# Patient Record
Sex: Male | Born: 1935 | Race: White | Hispanic: No | Smoking: Never smoker
Health system: Southern US, Community
[De-identification: ages and names within clinical notes are randomized; demographics above are authoritative.]

## PROBLEM LIST (undated history)

## (undated) DIAGNOSIS — I10 Essential (primary) hypertension: Secondary | ICD-10-CM

## (undated) DIAGNOSIS — E119 Type 2 diabetes mellitus without complications: Secondary | ICD-10-CM

## (undated) DIAGNOSIS — M109 Gout, unspecified: Secondary | ICD-10-CM

## (undated) DIAGNOSIS — N189 Chronic kidney disease, unspecified: Secondary | ICD-10-CM

## (undated) DIAGNOSIS — E7800 Pure hypercholesterolemia, unspecified: Secondary | ICD-10-CM

## (undated) DIAGNOSIS — N200 Calculus of kidney: Secondary | ICD-10-CM

## (undated) DIAGNOSIS — K279 Peptic ulcer, site unspecified, unspecified as acute or chronic, without hemorrhage or perforation: Secondary | ICD-10-CM

## (undated) DIAGNOSIS — M1990 Unspecified osteoarthritis, unspecified site: Secondary | ICD-10-CM

## (undated) HISTORY — DX: Chronic kidney disease, unspecified: N18.9

## (undated) HISTORY — DX: Pure hypercholesterolemia, unspecified: E78.00

## (undated) HISTORY — DX: Gout, unspecified: M10.9

## (undated) HISTORY — DX: Unspecified osteoarthritis, unspecified site: M19.90

## (undated) HISTORY — DX: Type 2 diabetes mellitus without complications: E11.9

## (undated) HISTORY — DX: Peptic ulcer, site unspecified, unspecified as acute or chronic, without hemorrhage or perforation: K27.9

## (undated) HISTORY — PX: BACK SURGERY: SHX140

## (undated) HISTORY — DX: Calculus of kidney: N20.0

## (undated) HISTORY — DX: Essential (primary) hypertension: I10

---

## 1998-07-24 ENCOUNTER — Encounter: Payer: Self-pay | Admitting: Internal Medicine

## 1998-07-24 ENCOUNTER — Ambulatory Visit (HOSPITAL_COMMUNITY): Admission: RE | Admit: 1998-07-24 | Discharge: 1998-07-24 | Payer: Self-pay | Admitting: Internal Medicine

## 2001-01-05 ENCOUNTER — Ambulatory Visit (HOSPITAL_COMMUNITY): Admission: RE | Admit: 2001-01-05 | Discharge: 2001-01-05 | Payer: Self-pay | Admitting: Family Medicine

## 2001-01-05 ENCOUNTER — Encounter: Payer: Self-pay | Admitting: Family Medicine

## 2001-10-27 ENCOUNTER — Encounter: Payer: Self-pay | Admitting: *Deleted

## 2001-10-27 ENCOUNTER — Ambulatory Visit (HOSPITAL_COMMUNITY): Admission: RE | Admit: 2001-10-27 | Discharge: 2001-10-27 | Payer: Self-pay | Admitting: *Deleted

## 2002-07-30 ENCOUNTER — Encounter: Payer: Self-pay | Admitting: Family Medicine

## 2002-07-30 ENCOUNTER — Ambulatory Visit (HOSPITAL_COMMUNITY): Admission: RE | Admit: 2002-07-30 | Discharge: 2002-07-30 | Payer: Self-pay | Admitting: Family Medicine

## 2004-04-02 ENCOUNTER — Ambulatory Visit (HOSPITAL_COMMUNITY): Admission: RE | Admit: 2004-04-02 | Discharge: 2004-04-02 | Payer: Self-pay | Admitting: Gastroenterology

## 2005-08-12 ENCOUNTER — Ambulatory Visit (HOSPITAL_COMMUNITY): Admission: RE | Admit: 2005-08-12 | Discharge: 2005-08-12 | Payer: Self-pay | Admitting: Family Medicine

## 2007-01-11 ENCOUNTER — Emergency Department (HOSPITAL_COMMUNITY): Admission: EM | Admit: 2007-01-11 | Discharge: 2007-01-11 | Payer: Self-pay | Admitting: Emergency Medicine

## 2008-12-07 ENCOUNTER — Inpatient Hospital Stay (HOSPITAL_COMMUNITY): Admission: EM | Admit: 2008-12-07 | Discharge: 2008-12-15 | Payer: Self-pay | Admitting: Emergency Medicine

## 2008-12-09 ENCOUNTER — Ambulatory Visit: Payer: Self-pay | Admitting: Infectious Diseases

## 2008-12-09 ENCOUNTER — Encounter: Payer: Self-pay | Admitting: Internal Medicine

## 2008-12-09 ENCOUNTER — Encounter (INDEPENDENT_AMBULATORY_CARE_PROVIDER_SITE_OTHER): Payer: Self-pay | Admitting: Neurosurgery

## 2008-12-12 ENCOUNTER — Encounter (INDEPENDENT_AMBULATORY_CARE_PROVIDER_SITE_OTHER): Payer: Self-pay | Admitting: Internal Medicine

## 2008-12-15 ENCOUNTER — Inpatient Hospital Stay: Admission: RE | Admit: 2008-12-15 | Discharge: 2009-01-05 | Payer: Self-pay | Admitting: Internal Medicine

## 2009-01-24 ENCOUNTER — Encounter: Admission: RE | Admit: 2009-01-24 | Discharge: 2009-01-24 | Payer: Self-pay | Admitting: Internal Medicine

## 2009-01-25 ENCOUNTER — Encounter: Payer: Self-pay | Admitting: Infectious Diseases

## 2009-01-31 ENCOUNTER — Encounter: Admission: RE | Admit: 2009-01-31 | Discharge: 2009-01-31 | Payer: Self-pay | Admitting: Internal Medicine

## 2009-02-06 ENCOUNTER — Ambulatory Visit: Payer: Self-pay | Admitting: Infectious Diseases

## 2009-02-06 DIAGNOSIS — M216X9 Other acquired deformities of unspecified foot: Secondary | ICD-10-CM | POA: Insufficient documentation

## 2009-02-06 DIAGNOSIS — M109 Gout, unspecified: Secondary | ICD-10-CM

## 2009-02-06 DIAGNOSIS — G062 Extradural and subdural abscess, unspecified: Secondary | ICD-10-CM | POA: Insufficient documentation

## 2009-02-06 LAB — CONVERTED CEMR LAB
Basophils Absolute: 0 10*3/uL (ref 0.0–0.1)
Basophils Relative: 2 % — ABNORMAL HIGH (ref 0–1)
CRP: 1.4 mg/dL — ABNORMAL HIGH (ref <0.6)
Eosinophils Absolute: 0 10*3/uL (ref 0.0–0.7)
Eosinophils Relative: 0 % (ref 0–5)
HCT: 24.3 % — ABNORMAL LOW (ref 39.0–52.0)
Hemoglobin: 8 g/dL — ABNORMAL LOW (ref 13.0–17.0)
Lymphocytes Relative: 44 % (ref 12–46)
MCHC: 32.9 g/dL (ref 30.0–36.0)
MCV: 86.2 fL (ref >=78.0)
Monocytes Absolute: 0.6 10*3/uL (ref 0.1–1.0)
RDW: 17.1 % — ABNORMAL HIGH (ref 11.5–15.5)

## 2009-02-15 ENCOUNTER — Encounter: Payer: Self-pay | Admitting: Infectious Diseases

## 2009-03-12 ENCOUNTER — Encounter: Payer: Self-pay | Admitting: Emergency Medicine

## 2009-03-12 ENCOUNTER — Ambulatory Visit: Payer: Self-pay | Admitting: Diagnostic Radiology

## 2009-03-12 ENCOUNTER — Inpatient Hospital Stay (HOSPITAL_COMMUNITY): Admission: EM | Admit: 2009-03-12 | Discharge: 2009-03-15 | Payer: Self-pay | Admitting: Internal Medicine

## 2009-03-14 ENCOUNTER — Ambulatory Visit: Payer: Self-pay | Admitting: Pulmonary Disease

## 2009-03-14 ENCOUNTER — Other Ambulatory Visit: Payer: Self-pay | Admitting: Infectious Diseases

## 2009-03-19 ENCOUNTER — Inpatient Hospital Stay (HOSPITAL_COMMUNITY): Admission: EM | Admit: 2009-03-19 | Discharge: 2009-03-23 | Payer: Self-pay | Admitting: Emergency Medicine

## 2009-03-20 ENCOUNTER — Ambulatory Visit: Payer: Self-pay | Admitting: Infectious Diseases

## 2009-03-22 ENCOUNTER — Ambulatory Visit: Payer: Self-pay | Admitting: Vascular Surgery

## 2009-03-22 ENCOUNTER — Encounter (INDEPENDENT_AMBULATORY_CARE_PROVIDER_SITE_OTHER): Payer: Self-pay | Admitting: Internal Medicine

## 2009-03-24 ENCOUNTER — Emergency Department (HOSPITAL_COMMUNITY): Admission: EM | Admit: 2009-03-24 | Discharge: 2009-03-24 | Payer: Self-pay | Admitting: Emergency Medicine

## 2009-04-02 ENCOUNTER — Inpatient Hospital Stay (HOSPITAL_COMMUNITY): Admission: EM | Admit: 2009-04-02 | Discharge: 2009-04-05 | Payer: Self-pay | Admitting: Emergency Medicine

## 2009-04-03 ENCOUNTER — Telehealth: Payer: Self-pay | Admitting: Infectious Diseases

## 2009-04-06 ENCOUNTER — Inpatient Hospital Stay (HOSPITAL_COMMUNITY): Admission: EM | Admit: 2009-04-06 | Discharge: 2009-04-11 | Payer: Self-pay | Admitting: Emergency Medicine

## 2009-04-24 ENCOUNTER — Emergency Department (HOSPITAL_COMMUNITY): Admission: EM | Admit: 2009-04-24 | Discharge: 2009-04-25 | Payer: Self-pay | Admitting: Emergency Medicine

## 2009-05-17 ENCOUNTER — Telehealth: Payer: Self-pay

## 2010-07-16 ENCOUNTER — Encounter: Payer: Self-pay | Admitting: Infectious Disease

## 2010-09-26 LAB — URINALYSIS, ROUTINE W REFLEX MICROSCOPIC
Bilirubin Urine: NEGATIVE
Nitrite: NEGATIVE
Specific Gravity, Urine: 1.025 (ref 1.005–1.030)
pH: 5 (ref 5.0–8.0)

## 2010-09-26 LAB — URINE MICROSCOPIC-ADD ON

## 2010-09-26 LAB — POCT I-STAT, CHEM 8
BUN: 31 mg/dL — ABNORMAL HIGH (ref 6–23)
Chloride: 101 mEq/L (ref 96–112)
Sodium: 134 mEq/L — ABNORMAL LOW (ref 135–145)

## 2010-09-26 LAB — URINE CULTURE

## 2010-09-27 LAB — GLUCOSE, CAPILLARY
Glucose-Capillary: 110 mg/dL — ABNORMAL HIGH (ref 70–99)
Glucose-Capillary: 118 mg/dL — ABNORMAL HIGH (ref 70–99)
Glucose-Capillary: 132 mg/dL — ABNORMAL HIGH (ref 70–99)
Glucose-Capillary: 59 mg/dL — ABNORMAL LOW (ref 70–99)
Glucose-Capillary: 63 mg/dL — ABNORMAL LOW (ref 70–99)
Glucose-Capillary: 70 mg/dL (ref 70–99)
Glucose-Capillary: 90 mg/dL (ref 70–99)
Glucose-Capillary: 133 mg/dL — ABNORMAL HIGH (ref 70–99)
Glucose-Capillary: 145 mg/dL — ABNORMAL HIGH (ref 70–99)
Glucose-Capillary: 66 mg/dL — ABNORMAL LOW (ref 70–99)
Glucose-Capillary: 88 mg/dL (ref 70–99)

## 2010-09-27 LAB — COMPREHENSIVE METABOLIC PANEL
ALT: 12 U/L (ref 0–53)
ALT: 16 U/L (ref 0–53)
AST: 121 U/L — ABNORMAL HIGH (ref 0–37)
AST: 13 U/L (ref 0–37)
Albumin: 1.8 g/dL — ABNORMAL LOW (ref 3.5–5.2)
Albumin: 1.8 g/dL — ABNORMAL LOW (ref 3.5–5.2)
Albumin: 2 g/dL — ABNORMAL LOW (ref 3.5–5.2)
Alkaline Phosphatase: 55 U/L (ref 39–117)
Alkaline Phosphatase: 76 U/L (ref 39–117)
BUN: 10 mg/dL (ref 6–23)
BUN: 12 mg/dL (ref 6–23)
BUN: 7 mg/dL (ref 6–23)
BUN: 8 mg/dL (ref 6–23)
BUN: 19 mg/dL (ref 6–23)
BUN: 33 mg/dL — ABNORMAL HIGH (ref 6–23)
CO2: 22 mEq/L (ref 19–32)
CO2: 22 mEq/L (ref 19–32)
CO2: 22 mEq/L (ref 19–32)
CO2: 21 mEq/L (ref 19–32)
Calcium: 8 mg/dL — ABNORMAL LOW (ref 8.4–10.5)
Calcium: 8.1 mg/dL — ABNORMAL LOW (ref 8.4–10.5)
Calcium: 8.3 mg/dL — ABNORMAL LOW (ref 8.4–10.5)
Calcium: 9.3 mg/dL (ref 8.4–10.5)
Chloride: 105 mEq/L (ref 96–112)
Chloride: 98 mEq/L (ref 96–112)
Creatinine, Ser: 0.83 mg/dL (ref 0.4–1.5)
Creatinine, Ser: 0.88 mg/dL (ref 0.4–1.5)
Creatinine, Ser: 0.89 mg/dL (ref 0.4–1.5)
Creatinine, Ser: 0.91 mg/dL (ref 0.4–1.5)
Creatinine, Ser: 1.08 mg/dL (ref 0.4–1.5)
Creatinine, Ser: 1.75 mg/dL — ABNORMAL HIGH (ref 0.4–1.5)
GFR calc Af Amer: >60 mL/min (ref >60)
GFR calc Af Amer: >60 mL/min (ref >60)
GFR calc non Af Amer: >60 mL/min (ref >60)
GFR calc non Af Amer: >60 mL/min (ref >60)
GFR calc non Af Amer: 38 mL/min — ABNORMAL LOW (ref >60)
GFR calc non Af Amer: 57 mL/min — ABNORMAL LOW (ref >60)
Glucose, Bld: 41 mg/dL — ABNORMAL LOW (ref 70–99)
Glucose, Bld: 81 mg/dL (ref 70–99)
Glucose, Bld: 132 mg/dL — ABNORMAL HIGH (ref 70–99)
Glucose, Bld: 49 mg/dL — ABNORMAL LOW (ref 70–99)
Potassium: 3.4 mEq/L — ABNORMAL LOW (ref 3.5–5.1)
Potassium: 4.2 mEq/L (ref 3.5–5.1)
Sodium: 129 mEq/L — ABNORMAL LOW (ref 135–145)
Sodium: 133 mEq/L — ABNORMAL LOW (ref 135–145)
Total Bilirubin: 0.6 mg/dL (ref 0.3–1.2)
Total Bilirubin: 0.7 mg/dL (ref 0.3–1.2)
Total Bilirubin: 0.7 mg/dL (ref 0.3–1.2)
Total Protein: 5 g/dL — ABNORMAL LOW (ref 6.0–8.3)
Total Protein: 5.3 g/dL — ABNORMAL LOW (ref 6.0–8.3)
Total Protein: 6.8 g/dL (ref 6.0–8.3)

## 2010-09-27 LAB — RENAL FUNCTION PANEL
Albumin: 2.4 g/dL — ABNORMAL LOW (ref 3.5–5.2)
Albumin: 2.5 g/dL — ABNORMAL LOW (ref 3.5–5.2)
BUN: 28 mg/dL — ABNORMAL HIGH (ref 6–23)
Calcium: 8.4 mg/dL (ref 8.4–10.5)
Calcium: 8.5 mg/dL (ref 8.4–10.5)
Chloride: 101 mEq/L (ref 96–112)
Creatinine, Ser: 1.23 mg/dL (ref 0.4–1.5)
Creatinine, Ser: 1.24 mg/dL (ref 0.4–1.5)
GFR calc Af Amer: >60 mL/min (ref >60)
GFR calc non Af Amer: 58 mL/min — ABNORMAL LOW (ref >60)
Glucose, Bld: 81 mg/dL (ref 70–99)
Phosphorus: 3.7 mg/dL (ref 2.3–4.6)
Potassium: 3.8 mEq/L (ref 3.5–5.1)
Sodium: 134 mEq/L — ABNORMAL LOW (ref 135–145)

## 2010-09-27 LAB — HEMOGLOBIN A1C: Mean Plasma Glucose: 105 mg/dL

## 2010-09-27 LAB — CULTURE, BLOOD (ROUTINE X 2)
Culture: NO GROWTH
Culture: NO GROWTH

## 2010-09-27 LAB — URINE CULTURE
Colony Count: NO GROWTH
Colony Count: NO GROWTH
Culture: NO GROWTH
Culture: NO GROWTH

## 2010-09-27 LAB — CLOSTRIDIUM DIFFICILE EIA
C difficile Toxins A+B, EIA: NEGATIVE
C difficile Toxins A+B, EIA: NEGATIVE
C difficile Toxins A+B, EIA: NEGATIVE

## 2010-09-27 LAB — BASIC METABOLIC PANEL
BUN: 14 mg/dL (ref 6–23)
BUN: 22 mg/dL (ref 6–23)
CO2: 23 mEq/L (ref 19–32)
CO2: 25 mEq/L (ref 19–32)
Calcium: 8.5 mg/dL (ref 8.4–10.5)
Chloride: 101 mEq/L (ref 96–112)
Creatinine, Ser: 1.23 mg/dL (ref 0.4–1.5)
GFR calc Af Amer: >60 mL/min (ref >60)
GFR calc non Af Amer: 57 mL/min — ABNORMAL LOW (ref >60)
GFR calc non Af Amer: 58 mL/min — ABNORMAL LOW (ref >60)
Glucose, Bld: 127 mg/dL — ABNORMAL HIGH (ref 70–99)
Glucose, Bld: 98 mg/dL (ref 70–99)
Potassium: 3.7 mEq/L (ref 3.5–5.1)
Potassium: 4.3 mEq/L (ref 3.5–5.1)
Sodium: 134 mEq/L — ABNORMAL LOW (ref 135–145)

## 2010-09-27 LAB — PHOSPHORUS: Phosphorus: 3.7 mg/dL (ref 2.3–4.6)

## 2010-09-27 LAB — URINALYSIS, ROUTINE W REFLEX MICROSCOPIC
Bilirubin Urine: NEGATIVE
Glucose, UA: NEGATIVE mg/dL
Ketones, ur: NEGATIVE mg/dL
Nitrite: NEGATIVE
Protein, ur: 30 mg/dL — AB
Urobilinogen, UA: 0.2 mg/dL (ref 0.0–1.0)
Urobilinogen, UA: 0.2 mg/dL (ref 0.0–1.0)

## 2010-09-27 LAB — ABO/RH: ABO/RH(D): A POS

## 2010-09-27 LAB — DIFFERENTIAL
Basophils Absolute: 0 10*3/uL (ref 0.0–0.1)
Basophils Absolute: 0 10*3/uL (ref 0.0–0.1)
Basophils Relative: 0 % (ref 0–1)
Basophils Relative: 0 % (ref 0–1)
Eosinophils Absolute: 0 10*3/uL (ref 0.0–0.7)
Eosinophils Relative: 1 % (ref 0–5)
Eosinophils Relative: 0 % (ref 0–5)
Eosinophils Relative: 0 % (ref 0–5)
Lymphocytes Relative: 5 % — ABNORMAL LOW (ref 12–46)
Lymphocytes Relative: 8 % — ABNORMAL LOW (ref 12–46)
Lymphocytes Relative: 5 % — ABNORMAL LOW (ref 12–46)
Lymphocytes Relative: 6 % — ABNORMAL LOW (ref 12–46)
Lymphocytes Relative: 7 % — ABNORMAL LOW (ref 12–46)
Lymphs Abs: 0.6 10*3/uL — ABNORMAL LOW (ref 0.7–4.0)
Lymphs Abs: 0.4 10*3/uL — ABNORMAL LOW (ref 0.7–4.0)
Monocytes Absolute: 0.5 10*3/uL (ref 0.1–1.0)
Monocytes Absolute: 0.5 10*3/uL (ref 0.1–1.0)
Monocytes Absolute: 0.7 10*3/uL (ref 0.1–1.0)
Monocytes Relative: 7 % (ref 3–12)
Monocytes Relative: 4 % (ref 3–12)
Monocytes Relative: 6 % (ref 3–12)
Neutro Abs: 6.5 10*3/uL (ref 1.7–7.7)
Neutro Abs: 7.1 10*3/uL (ref 1.7–7.7)
Neutrophils Relative %: 84 % — ABNORMAL HIGH (ref 43–77)
Neutrophils Relative %: 88 % — ABNORMAL HIGH (ref 43–77)
Neutrophils Relative %: 90 % — ABNORMAL HIGH (ref 43–77)
WBC Morphology: INCREASED

## 2010-09-27 LAB — CBC
HCT: 27.9 % — ABNORMAL LOW (ref 39.0–52.0)
HCT: 29.8 % — ABNORMAL LOW (ref 39.0–52.0)
HCT: 30.7 % — ABNORMAL LOW (ref 39.0–52.0)
HCT: 25.8 % — ABNORMAL LOW (ref 39.0–52.0)
HCT: 29.5 % — ABNORMAL LOW (ref 39.0–52.0)
Hemoglobin: 9.8 g/dL — ABNORMAL LOW (ref 13.0–17.0)
Hemoglobin: 9.9 g/dL — ABNORMAL LOW (ref 13.0–17.0)
Hemoglobin: 10.3 g/dL — ABNORMAL LOW (ref 13.0–17.0)
MCHC: 32.5 g/dL (ref 30.0–36.0)
MCHC: 33.1 g/dL (ref 30.0–36.0)
MCHC: 33.2 g/dL (ref 30.0–36.0)
MCHC: 31.7 g/dL (ref 30.0–36.0)
MCHC: 32.4 g/dL (ref 30.0–36.0)
MCHC: 32.7 g/dL (ref 30.0–36.0)
MCHC: 33.4 g/dL (ref 30.0–36.0)
MCHC: 33.9 g/dL (ref 30.0–36.0)
MCV: 92.4 fL (ref 78.0–100.0)
MCV: 92.7 fL (ref 78.0–100.0)
MCV: 95.5 fL (ref 78.0–100.0)
MCV: 95.5 fL (ref 78.0–100.0)
MCV: 95.6 fL (ref 78.0–100.0)
MCV: 95.6 fL (ref 78.0–100.0)
MCV: 95.9 fL (ref 78.0–100.0)
Platelets: 247 10*3/uL (ref 150–400)
Platelets: 191 10*3/uL (ref 150–400)
Platelets: 226 10*3/uL (ref 150–400)
Platelets: 258 10*3/uL (ref 150–400)
Platelets: 265 10*3/uL (ref 150–400)
Platelets: 278 10*3/uL (ref 150–400)
Platelets: 288 10*3/uL (ref 150–400)
RBC: 3.02 MIL/uL — ABNORMAL LOW (ref 4.22–5.81)
RBC: 3.19 MIL/uL — ABNORMAL LOW (ref 4.22–5.81)
RBC: 3.28 MIL/uL — ABNORMAL LOW (ref 4.22–5.81)
RBC: 3.13 MIL/uL — ABNORMAL LOW (ref 4.22–5.81)
RDW: 20.8 % — ABNORMAL HIGH (ref 11.5–15.5)
RDW: 20.9 % — ABNORMAL HIGH (ref 11.5–15.5)
RDW: 20 % — ABNORMAL HIGH (ref 11.5–15.5)
RDW: 20 % — ABNORMAL HIGH (ref 11.5–15.5)
RDW: 20.3 % — ABNORMAL HIGH (ref 11.5–15.5)
RDW: 20.9 % — ABNORMAL HIGH (ref 11.5–15.5)
RDW: 21.1 % — ABNORMAL HIGH (ref 11.5–15.5)
WBC: 8 10*3/uL (ref 4.0–10.5)
WBC: 13 10*3/uL — ABNORMAL HIGH (ref 4.0–10.5)
WBC: 16.1 10*3/uL — ABNORMAL HIGH (ref 4.0–10.5)
WBC: 9 10*3/uL (ref 4.0–10.5)
WBC: 9.2 10*3/uL (ref 4.0–10.5)

## 2010-09-27 LAB — MAGNESIUM
Magnesium: 1.6 mg/dL (ref 1.5–2.5)
Magnesium: 1.6 mg/dL (ref 1.5–2.5)
Magnesium: 1.9 mg/dL (ref 1.5–2.5)

## 2010-09-27 LAB — OVA AND PARASITE EXAMINATION

## 2010-09-27 LAB — CARDIAC PANEL(CRET KIN+CKTOT+MB+TROPI)
Relative Index: INVALID (ref 0.0–2.5)
Total CK: 12 U/L (ref 7–232)
Troponin I: 0.02 ng/mL (ref 0.00–0.06)

## 2010-09-27 LAB — URINE MICROSCOPIC-ADD ON

## 2010-09-27 LAB — FECAL LACTOFERRIN, QUANT

## 2010-09-27 LAB — CROSSMATCH: Antibody Screen: NEGATIVE

## 2010-09-27 LAB — STOOL CULTURE

## 2010-09-27 LAB — T4, FREE: Free T4: 1.27 ng/dL (ref 0.80–1.80)

## 2010-09-27 LAB — FERRITIN: Ferritin: 367 ng/mL — ABNORMAL HIGH (ref 22–322)

## 2010-09-27 LAB — IRON AND TIBC

## 2010-09-28 LAB — COMPREHENSIVE METABOLIC PANEL
ALT: 12 U/L (ref 0–53)
ALT: 12 U/L (ref 0–53)
ALT: 9 U/L (ref 0–53)
AST: 13 U/L (ref 0–37)
AST: 14 U/L (ref 0–37)
AST: 9 U/L (ref 0–37)
Albumin: 2.4 g/dL — ABNORMAL LOW (ref 3.5–5.2)
Albumin: 2.7 g/dL — ABNORMAL LOW (ref 3.5–5.2)
Alkaline Phosphatase: 54 U/L (ref 39–117)
BUN: 23 mg/dL (ref 6–23)
BUN: 32 mg/dL — ABNORMAL HIGH (ref 6–23)
CO2: 21 mEq/L (ref 19–32)
CO2: 21 mEq/L (ref 19–32)
CO2: 17 mEq/L — ABNORMAL LOW (ref 19–32)
Calcium: 7.7 mg/dL — ABNORMAL LOW (ref 8.4–10.5)
Calcium: 8 mg/dL — ABNORMAL LOW (ref 8.4–10.5)
Calcium: 8.7 mg/dL (ref 8.4–10.5)
Calcium: 9.3 mg/dL (ref 8.4–10.5)
Chloride: 107 mEq/L (ref 96–112)
Chloride: 113 mEq/L — ABNORMAL HIGH (ref 96–112)
Creatinine, Ser: 0.97 mg/dL (ref 0.4–1.5)
Creatinine, Ser: 1.13 mg/dL (ref 0.4–1.5)
Creatinine, Ser: 1.2 mg/dL (ref 0.4–1.5)
GFR calc Af Amer: >60 mL/min (ref >60)
GFR calc Af Amer: >60 mL/min (ref >60)
GFR calc Af Amer: >60 mL/min (ref >60)
GFR calc non Af Amer: >60 mL/min (ref >60)
GFR calc non Af Amer: >60 mL/min (ref >60)
GFR calc non Af Amer: 59 mL/min — ABNORMAL LOW (ref >60)
Glucose, Bld: 145 mg/dL — ABNORMAL HIGH (ref 70–99)
Glucose, Bld: 157 mg/dL — ABNORMAL HIGH (ref 70–99)
Glucose, Bld: 183 mg/dL — ABNORMAL HIGH (ref 70–99)
Potassium: 3.7 mEq/L (ref 3.5–5.1)
Potassium: 4.2 mEq/L (ref 3.5–5.1)
Sodium: 136 mEq/L (ref 135–145)
Sodium: 140 mEq/L (ref 135–145)
Sodium: 136 mEq/L (ref 135–145)
Total Bilirubin: 0.5 mg/dL (ref 0.3–1.2)
Total Protein: 5.7 g/dL — ABNORMAL LOW (ref 6.0–8.3)
Total Protein: 6.2 g/dL (ref 6.0–8.3)

## 2010-09-28 LAB — CBC
HCT: 25.3 % — ABNORMAL LOW (ref 39.0–52.0)
HCT: 25.6 % — ABNORMAL LOW (ref 39.0–52.0)
HCT: 28.3 % — ABNORMAL LOW (ref 39.0–52.0)
HCT: 28.8 % — ABNORMAL LOW (ref 39.0–52.0)
HCT: 32.6 % — ABNORMAL LOW (ref 39.0–52.0)
Hemoglobin: 10.5 g/dL — ABNORMAL LOW (ref 13.0–17.0)
Hemoglobin: 8.1 g/dL — ABNORMAL LOW (ref 13.0–17.0)
Hemoglobin: 8.1 g/dL — ABNORMAL LOW (ref 13.0–17.0)
Hemoglobin: 8.4 g/dL — ABNORMAL LOW (ref 13.0–17.0)
Hemoglobin: 8.5 g/dL — ABNORMAL LOW (ref 13.0–17.0)
Hemoglobin: 8.6 g/dL — ABNORMAL LOW (ref 13.0–17.0)
Hemoglobin: 9.4 g/dL — ABNORMAL LOW (ref 13.0–17.0)
Hemoglobin: 11.3 g/dL — ABNORMAL LOW (ref 13.0–17.0)
MCHC: 33.2 g/dL (ref 30.0–36.0)
MCHC: 33.4 g/dL (ref 30.0–36.0)
MCHC: 33.6 g/dL (ref 30.0–36.0)
MCHC: 33.8 g/dL (ref 30.0–36.0)
MCHC: 33.8 g/dL (ref 30.0–36.0)
MCHC: 34.6 g/dL (ref 30.0–36.0)
MCV: 94.6 fL (ref 78.0–100.0)
MCV: 94.9 fL (ref 78.0–100.0)
MCV: 94.9 fL (ref 78.0–100.0)
MCV: 95 fL (ref 78.0–100.0)
MCV: 95.2 fL (ref 78.0–100.0)
MCV: 95.4 fL (ref 78.0–100.0)
MCV: 95.5 fL (ref 78.0–100.0)
MCV: 95.7 fL (ref 78.0–100.0)
MCV: 96.1 fL (ref 78.0–100.0)
MCV: 93.6 fL (ref 78.0–100.0)
Platelets: 224 10*3/uL (ref 150–400)
Platelets: 242 10*3/uL (ref 150–400)
Platelets: 273 10*3/uL (ref 150–400)
Platelets: 277 10*3/uL (ref 150–400)
Platelets: 287 10*3/uL (ref 150–400)
RBC: 2.49 MIL/uL — ABNORMAL LOW (ref 4.22–5.81)
RBC: 2.5 MIL/uL — ABNORMAL LOW (ref 4.22–5.81)
RBC: 2.56 MIL/uL — ABNORMAL LOW (ref 4.22–5.81)
RBC: 2.73 MIL/uL — ABNORMAL LOW (ref 4.22–5.81)
RBC: 2.96 MIL/uL — ABNORMAL LOW (ref 4.22–5.81)
RBC: 3.04 MIL/uL — ABNORMAL LOW (ref 4.22–5.81)
RBC: 3.48 MIL/uL — ABNORMAL LOW (ref 4.22–5.81)
RDW: 21.9 % — ABNORMAL HIGH (ref 11.5–15.5)
RDW: 21.9 % — ABNORMAL HIGH (ref 11.5–15.5)
RDW: 22.5 % — ABNORMAL HIGH (ref 11.5–15.5)
WBC: 13.8 10*3/uL — ABNORMAL HIGH (ref 4.0–10.5)
WBC: 13.9 10*3/uL — ABNORMAL HIGH (ref 4.0–10.5)
WBC: 14.2 10*3/uL — ABNORMAL HIGH (ref 4.0–10.5)
WBC: 17.2 10*3/uL — ABNORMAL HIGH (ref 4.0–10.5)
WBC: 7.1 10*3/uL (ref 4.0–10.5)
WBC: 8.8 10*3/uL (ref 4.0–10.5)
WBC: 9.9 10*3/uL (ref 4.0–10.5)

## 2010-09-28 LAB — URINALYSIS, MICROSCOPIC ONLY
Bilirubin Urine: NEGATIVE
Hgb urine dipstick: NEGATIVE
Ketones, ur: NEGATIVE mg/dL
Nitrite: NEGATIVE
Protein, ur: NEGATIVE mg/dL
Urobilinogen, UA: 0.2 mg/dL (ref 0.0–1.0)

## 2010-09-28 LAB — CULTURE, BLOOD (ROUTINE X 2)
Culture: NO GROWTH
Culture: NO GROWTH

## 2010-09-28 LAB — SEDIMENTATION RATE
Sed Rate: 100 mm/hr — ABNORMAL HIGH (ref 0–16)
Sed Rate: 79 mm/hr — ABNORMAL HIGH (ref 0–16)

## 2010-09-28 LAB — URINALYSIS, ROUTINE W REFLEX MICROSCOPIC
Glucose, UA: NEGATIVE mg/dL
Glucose, UA: NEGATIVE mg/dL
Ketones, ur: 40 mg/dL — AB
Ketones, ur: NEGATIVE mg/dL
Leukocytes, UA: NEGATIVE
Leukocytes, UA: NEGATIVE
Nitrite: NEGATIVE
Protein, ur: 30 mg/dL — AB
Protein, ur: 30 mg/dL — AB
Urobilinogen, UA: 0.2 mg/dL (ref 0.0–1.0)
Urobilinogen, UA: 0.2 mg/dL (ref 0.0–1.0)

## 2010-09-28 LAB — DIFFERENTIAL
Basophils Relative: 0 % (ref 0–1)
Basophils Relative: 0 % (ref 0–1)
Basophils Relative: 0 % (ref 0–1)
Basophils Relative: 0 % (ref 0–1)
Eosinophils Absolute: 0 10*3/uL (ref 0.0–0.7)
Eosinophils Absolute: 0 10*3/uL (ref 0.0–0.7)
Eosinophils Absolute: 0 10*3/uL (ref 0.0–0.7)
Eosinophils Absolute: 0 10*3/uL (ref 0.0–0.7)
Eosinophils Relative: 0 % (ref 0–5)
Eosinophils Relative: 0 % (ref 0–5)
Eosinophils Relative: 0 % (ref 0–5)
Eosinophils Relative: 0 % (ref 0–5)
Eosinophils Relative: 0 % (ref 0–5)
Eosinophils Relative: 0 % (ref 0–5)
Lymphocytes Relative: 11 % — ABNORMAL LOW (ref 12–46)
Lymphocytes Relative: 5 % — ABNORMAL LOW (ref 12–46)
Lymphocytes Relative: 8 % — ABNORMAL LOW (ref 12–46)
Lymphocytes Relative: 6 % — ABNORMAL LOW (ref 12–46)
Lymphs Abs: 0.8 10*3/uL (ref 0.7–4.0)
Lymphs Abs: 1 10*3/uL (ref 0.7–4.0)
Monocytes Absolute: 0.4 10*3/uL (ref 0.1–1.0)
Monocytes Absolute: 0.6 10*3/uL (ref 0.1–1.0)
Monocytes Absolute: 0.7 10*3/uL (ref 0.1–1.0)
Monocytes Absolute: 0.9 10*3/uL (ref 0.1–1.0)
Monocytes Absolute: 1.8 10*3/uL — ABNORMAL HIGH (ref 0.1–1.0)
Monocytes Relative: 3 % (ref 3–12)
Monocytes Relative: 5 % (ref 3–12)
Monocytes Relative: 5 % (ref 3–12)
Monocytes Relative: 8 % (ref 3–12)
Neutro Abs: 12.5 10*3/uL — ABNORMAL HIGH (ref 1.7–7.7)
Neutro Abs: 15.6 10*3/uL — ABNORMAL HIGH (ref 1.7–7.7)
Neutrophils Relative %: 87 % — ABNORMAL HIGH (ref 43–77)
Neutrophils Relative %: 87 % — ABNORMAL HIGH (ref 43–77)
Neutrophils Relative %: 87 % — ABNORMAL HIGH (ref 43–77)
WBC Morphology: INCREASED

## 2010-09-28 LAB — BASIC METABOLIC PANEL
BUN: 12 mg/dL (ref 6–23)
BUN: 13 mg/dL (ref 6–23)
BUN: 16 mg/dL (ref 6–23)
CO2: 26 mEq/L (ref 19–32)
Calcium: 7.9 mg/dL — ABNORMAL LOW (ref 8.4–10.5)
Calcium: 8.1 mg/dL — ABNORMAL LOW (ref 8.4–10.5)
Calcium: 8.3 mg/dL — ABNORMAL LOW (ref 8.4–10.5)
Chloride: 103 mEq/L (ref 96–112)
Chloride: 104 mEq/L (ref 96–112)
Chloride: 108 mEq/L (ref 96–112)
Chloride: 95 mEq/L — ABNORMAL LOW (ref 96–112)
Creatinine, Ser: 0.93 mg/dL (ref 0.4–1.5)
Creatinine, Ser: 0.95 mg/dL (ref 0.4–1.5)
Creatinine, Ser: 1.02 mg/dL (ref 0.4–1.5)
Creatinine, Ser: 1.06 mg/dL (ref 0.4–1.5)
GFR calc Af Amer: >60 mL/min (ref >60)
GFR calc Af Amer: >60 mL/min (ref >60)
GFR calc Af Amer: >60 mL/min (ref >60)
GFR calc non Af Amer: >60 mL/min (ref >60)
GFR calc non Af Amer: >60 mL/min (ref >60)
GFR calc non Af Amer: >60 mL/min (ref >60)
GFR calc non Af Amer: >60 mL/min (ref >60)
Glucose, Bld: 160 mg/dL — ABNORMAL HIGH (ref 70–99)
Glucose, Bld: 94 mg/dL (ref 70–99)
Potassium: 3.3 mEq/L — ABNORMAL LOW (ref 3.5–5.1)
Potassium: 3.6 mEq/L (ref 3.5–5.1)
Sodium: 134 mEq/L — ABNORMAL LOW (ref 135–145)
Sodium: 135 mEq/L (ref 135–145)
Sodium: 137 mEq/L (ref 135–145)

## 2010-09-28 LAB — HEPATIC FUNCTION PANEL
ALT: 8 U/L (ref 0–53)
AST: 17 U/L (ref 0–37)
Alkaline Phosphatase: 49 U/L (ref 39–117)
Indirect Bilirubin: 0.5 mg/dL (ref 0.3–0.9)
Total Protein: 5.6 g/dL — ABNORMAL LOW (ref 6.0–8.3)

## 2010-09-28 LAB — PROTIME-INR
INR: 1.4 (ref 0.00–1.49)
INR: 1.2 (ref 0.00–1.49)
Prothrombin Time: 15.1 seconds (ref 11.6–15.2)

## 2010-09-28 LAB — URINE MICROSCOPIC-ADD ON

## 2010-09-28 LAB — VANCOMYCIN, TROUGH: Vancomycin Tr: 15.6 ug/mL (ref 10.0–20.0)

## 2010-09-28 LAB — POCT CARDIAC MARKERS
CKMB, poc: 1.2 ng/mL (ref 1.0–8.0)
Myoglobin, poc: 138 ng/mL (ref 12–200)
Troponin i, poc: <0.05 ng/mL (ref 0.00–0.09)

## 2010-09-28 LAB — GLUCOSE, CAPILLARY
Glucose-Capillary: 116 mg/dL — ABNORMAL HIGH (ref 70–99)
Glucose-Capillary: 120 mg/dL — ABNORMAL HIGH (ref 70–99)
Glucose-Capillary: 135 mg/dL — ABNORMAL HIGH (ref 70–99)
Glucose-Capillary: 141 mg/dL — ABNORMAL HIGH (ref 70–99)
Glucose-Capillary: 145 mg/dL — ABNORMAL HIGH (ref 70–99)
Glucose-Capillary: 150 mg/dL — ABNORMAL HIGH (ref 70–99)
Glucose-Capillary: 194 mg/dL — ABNORMAL HIGH (ref 70–99)
Glucose-Capillary: 201 mg/dL — ABNORMAL HIGH (ref 70–99)
Glucose-Capillary: 253 mg/dL — ABNORMAL HIGH (ref 70–99)
Glucose-Capillary: 91 mg/dL (ref 70–99)

## 2010-09-28 LAB — CARDIAC PANEL(CRET KIN+CKTOT+MB+TROPI): Total CK: 21 U/L (ref 7–232)

## 2010-09-28 LAB — CLOSTRIDIUM DIFFICILE EIA: C difficile Toxins A+B, EIA: NEGATIVE

## 2010-09-28 LAB — MAGNESIUM
Magnesium: 1.8 mg/dL (ref 1.5–2.5)
Magnesium: 1.9 mg/dL (ref 1.5–2.5)

## 2010-09-28 LAB — BODY FLUID CULTURE: Culture: NO GROWTH

## 2010-09-28 LAB — URINE CULTURE
Colony Count: NO GROWTH
Colony Count: NO GROWTH
Culture: NO GROWTH
Culture: NO GROWTH

## 2010-09-28 LAB — TROPONIN I
Troponin I: 0.03 ng/mL (ref 0.00–0.06)
Troponin I: 0.03 ng/mL (ref 0.00–0.06)

## 2010-09-28 LAB — IRON AND TIBC
Iron: 45 ug/dL (ref 42–135)
Saturation Ratios: 27 % (ref 20–55)
TIBC: 166 ug/dL — ABNORMAL LOW (ref 215–435)

## 2010-09-28 LAB — CARBOXYHEMOGLOBIN
Carboxyhemoglobin: 1.7 % — ABNORMAL HIGH (ref 0.5–1.5)
Methemoglobin: 0.3 % (ref 0.0–1.5)
O2 Saturation: 73 %

## 2010-09-28 LAB — LACTIC ACID, PLASMA: Lactic Acid, Venous: 1 mmol/L (ref 0.5–2.2)

## 2010-09-28 LAB — BLOOD GAS, ARTERIAL
Acid-base deficit: 5.1 mmol/L — ABNORMAL HIGH (ref 0.0–2.0)
Bicarbonate: 18.5 mEq/L — ABNORMAL LOW (ref 20.0–24.0)
Drawn by: 296031
O2 Content: 2 L/min
O2 Saturation: 98.4 %
TCO2: 19.3 mmol/L (ref 0–100)
pO2, Arterial: 107 mmHg — ABNORMAL HIGH (ref 80.0–100.0)

## 2010-09-28 LAB — RETICULOCYTES: Retic Count, Absolute: 49.3 10*3/uL (ref 19.0–186.0)

## 2010-09-28 LAB — CK TOTAL AND CKMB (NOT AT ARMC)
CK, MB: 2.2 ng/mL (ref 0.3–4.0)
Relative Index: INVALID (ref 0.0–2.5)
Total CK: 21 U/L (ref 7–232)

## 2010-09-28 LAB — FERRITIN: Ferritin: 526 ng/mL — ABNORMAL HIGH (ref 22–322)

## 2010-09-28 LAB — SYNOVIAL CELL COUNT + DIFF, W/ CRYSTALS
Eosinophils-Synovial: 0 % (ref 0–1)
Neutrophil, Synovial: 88 % — ABNORMAL HIGH (ref 0–25)

## 2010-09-28 LAB — APTT: aPTT: 34 seconds (ref 24–37)

## 2010-09-30 LAB — BASIC METABOLIC PANEL
BUN: 18 mg/dL (ref 6–23)
CO2: 26 mEq/L (ref 19–32)
CO2: 29 mEq/L (ref 19–32)
Calcium: 8.7 mg/dL (ref 8.4–10.5)
Calcium: 8.8 mg/dL (ref 8.4–10.5)
Calcium: 9.3 mg/dL (ref 8.4–10.5)
Chloride: 103 mEq/L (ref 96–112)
GFR calc Af Amer: >60 mL/min (ref >60)
GFR calc Af Amer: >60 mL/min (ref >60)
GFR calc Af Amer: >60 mL/min (ref >60)
GFR calc non Af Amer: >60 mL/min (ref >60)
GFR calc non Af Amer: >60 mL/min (ref >60)
GFR calc non Af Amer: >60 mL/min (ref >60)
Glucose, Bld: 161 mg/dL — ABNORMAL HIGH (ref 70–99)
Glucose, Bld: 241 mg/dL — ABNORMAL HIGH (ref 70–99)
Potassium: 3.5 mEq/L (ref 3.5–5.1)
Potassium: 3.5 mEq/L (ref 3.5–5.1)
Potassium: 4 mEq/L (ref 3.5–5.1)
Potassium: 4.1 mEq/L (ref 3.5–5.1)
Sodium: 134 mEq/L — ABNORMAL LOW (ref 135–145)
Sodium: 135 mEq/L (ref 135–145)
Sodium: 136 mEq/L (ref 135–145)

## 2010-09-30 LAB — DIFFERENTIAL
Basophils Absolute: 0 10*3/uL (ref 0.0–0.1)
Lymphocytes Relative: 19 % (ref 12–46)
Lymphs Abs: 1.1 10*3/uL (ref 0.7–4.0)
Monocytes Absolute: 0.6 10*3/uL (ref 0.1–1.0)
Neutro Abs: 4.1 10*3/uL (ref 1.7–7.7)

## 2010-09-30 LAB — CBC
HCT: 31.6 % — ABNORMAL LOW (ref 39.0–52.0)
Hemoglobin: 10.7 g/dL — ABNORMAL LOW (ref 13.0–17.0)
MCHC: 34.3 g/dL (ref 30.0–36.0)
RBC: 3.47 MIL/uL — ABNORMAL LOW (ref 4.22–5.81)
RDW: 14.3 % (ref 11.5–15.5)
RDW: 14.4 % (ref 11.5–15.5)
WBC: 5.9 10*3/uL (ref 4.0–10.5)

## 2010-09-30 LAB — VANCOMYCIN, TROUGH
Vancomycin Tr: 10.5 ug/mL (ref 10.0–20.0)
Vancomycin Tr: 11.9 ug/mL (ref 10.0–20.0)
Vancomycin Tr: 13.8 ug/mL (ref 10.0–20.0)

## 2010-09-30 LAB — URIC ACID: Uric Acid, Serum: 5.6 mg/dL (ref 4.0–7.8)

## 2010-10-01 LAB — AFB CULTURE WITH SMEAR (NOT AT ARMC)

## 2010-10-01 LAB — POCT CARDIAC MARKERS
CKMB, poc: 6.5 ng/mL (ref 1.0–8.0)
Myoglobin, poc: >500 ng/mL (ref 12–200)

## 2010-10-01 LAB — CBC
HCT: 28.7 % — ABNORMAL LOW (ref 39.0–52.0)
HCT: 32.3 % — ABNORMAL LOW (ref 39.0–52.0)
HCT: 32.9 % — ABNORMAL LOW (ref 39.0–52.0)
Hemoglobin: 9.4 g/dL — ABNORMAL LOW (ref 13.0–17.0)
Hemoglobin: 9.6 g/dL — ABNORMAL LOW (ref 13.0–17.0)
Hemoglobin: 9.9 g/dL — ABNORMAL LOW (ref 13.0–17.0)
MCHC: 33.7 g/dL (ref 30.0–36.0)
MCHC: 33.8 g/dL (ref 30.0–36.0)
MCHC: 34.3 g/dL (ref 30.0–36.0)
MCHC: 34.4 g/dL (ref 30.0–36.0)
MCHC: 34.4 g/dL (ref 30.0–36.0)
MCV: 91.3 fL (ref 78.0–100.0)
MCV: 92.2 fL (ref 78.0–100.0)
MCV: 92.5 fL (ref 78.0–100.0)
Platelets: 258 10*3/uL (ref 150–400)
Platelets: 267 10*3/uL (ref 150–400)
Platelets: 271 10*3/uL (ref 150–400)
Platelets: 300 10*3/uL (ref 150–400)
Platelets: 314 10*3/uL (ref 150–400)
Platelets: 398 10*3/uL (ref 150–400)
RBC: 2.81 MIL/uL — ABNORMAL LOW (ref 4.22–5.81)
RBC: 3.08 MIL/uL — ABNORMAL LOW (ref 4.22–5.81)
RBC: 3.21 MIL/uL — ABNORMAL LOW (ref 4.22–5.81)
RBC: 3.57 MIL/uL — ABNORMAL LOW (ref 4.22–5.81)
RDW: 13.2 % (ref 11.5–15.5)
RDW: 13.4 % (ref 11.5–15.5)
RDW: 13.8 % (ref 11.5–15.5)
RDW: 13.8 % (ref 11.5–15.5)
RDW: 14.2 % (ref 11.5–15.5)
RDW: 14.3 % (ref 11.5–15.5)
WBC: 11.3 10*3/uL — ABNORMAL HIGH (ref 4.0–10.5)
WBC: 12.9 10*3/uL — ABNORMAL HIGH (ref 4.0–10.5)
WBC: 13.1 10*3/uL — ABNORMAL HIGH (ref 4.0–10.5)
WBC: 8.4 10*3/uL (ref 4.0–10.5)

## 2010-10-01 LAB — CK TOTAL AND CKMB (NOT AT ARMC): CK, MB: 3 ng/mL (ref 0.3–4.0)

## 2010-10-01 LAB — DIFFERENTIAL
Basophils Absolute: 0.1 10*3/uL (ref 0.0–0.1)
Basophils Absolute: 0.1 10*3/uL (ref 0.0–0.1)
Basophils Relative: 0 % (ref 0–1)
Basophils Relative: 1 % (ref 0–1)
Basophils Relative: 1 % (ref 0–1)
Eosinophils Absolute: 0 10*3/uL (ref 0.0–0.7)
Eosinophils Absolute: 0 10*3/uL (ref 0.0–0.7)
Eosinophils Absolute: 0 10*3/uL (ref 0.0–0.7)
Eosinophils Absolute: 0.2 10*3/uL (ref 0.0–0.7)
Eosinophils Relative: 0 % (ref 0–5)
Eosinophils Relative: 2 % (ref 0–5)
Lymphocytes Relative: 12 % (ref 12–46)
Lymphs Abs: 1.1 10*3/uL (ref 0.7–4.0)
Lymphs Abs: 1.2 10*3/uL (ref 0.7–4.0)
Monocytes Absolute: 0.5 10*3/uL (ref 0.1–1.0)
Monocytes Absolute: 0.8 10*3/uL (ref 0.1–1.0)
Monocytes Relative: 3 % (ref 3–12)
Monocytes Relative: 4 % (ref 3–12)
Monocytes Relative: 9 % (ref 3–12)
Neutro Abs: 9.4 10*3/uL — ABNORMAL HIGH (ref 1.7–7.7)
Neutrophils Relative %: 75 % (ref 43–77)
Neutrophils Relative %: 83 % — ABNORMAL HIGH (ref 43–77)
Neutrophils Relative %: 85 % — ABNORMAL HIGH (ref 43–77)
Neutrophils Relative %: 89 % — ABNORMAL HIGH (ref 43–77)

## 2010-10-01 LAB — GLUCOSE, CAPILLARY
Glucose-Capillary: 116 mg/dL — ABNORMAL HIGH (ref 70–99)
Glucose-Capillary: 126 mg/dL — ABNORMAL HIGH (ref 70–99)
Glucose-Capillary: 128 mg/dL — ABNORMAL HIGH (ref 70–99)
Glucose-Capillary: 145 mg/dL — ABNORMAL HIGH (ref 70–99)
Glucose-Capillary: 154 mg/dL — ABNORMAL HIGH (ref 70–99)
Glucose-Capillary: 178 mg/dL — ABNORMAL HIGH (ref 70–99)

## 2010-10-01 LAB — BASIC METABOLIC PANEL
BUN: 12 mg/dL (ref 6–23)
BUN: 15 mg/dL (ref 6–23)
BUN: 15 mg/dL (ref 6–23)
BUN: 15 mg/dL (ref 6–23)
BUN: 20 mg/dL (ref 6–23)
BUN: 39 mg/dL — ABNORMAL HIGH (ref 6–23)
CO2: 21 mEq/L (ref 19–32)
CO2: 25 mEq/L (ref 19–32)
CO2: 28 mEq/L (ref 19–32)
CO2: 29 mEq/L (ref 19–32)
CO2: 29 mEq/L (ref 19–32)
Calcium: 8.1 mg/dL — ABNORMAL LOW (ref 8.4–10.5)
Calcium: 8.5 mg/dL (ref 8.4–10.5)
Calcium: 8.6 mg/dL (ref 8.4–10.5)
Calcium: 8.6 mg/dL (ref 8.4–10.5)
Calcium: 8.6 mg/dL (ref 8.4–10.5)
Calcium: 8.7 mg/dL (ref 8.4–10.5)
Calcium: 8.7 mg/dL (ref 8.4–10.5)
Chloride: 100 mEq/L (ref 96–112)
Chloride: 103 mEq/L (ref 96–112)
Chloride: 107 mEq/L (ref 96–112)
Chloride: 98 mEq/L (ref 96–112)
Creatinine, Ser: 0.86 mg/dL (ref 0.4–1.5)
Creatinine, Ser: 0.9 mg/dL (ref 0.4–1.5)
Creatinine, Ser: 0.93 mg/dL (ref 0.4–1.5)
Creatinine, Ser: 1.07 mg/dL (ref 0.4–1.5)
Creatinine, Ser: 1.08 mg/dL (ref 0.4–1.5)
Creatinine, Ser: 1.17 mg/dL (ref 0.4–1.5)
GFR calc Af Amer: >60 mL/min (ref >60)
GFR calc Af Amer: >60 mL/min (ref >60)
GFR calc Af Amer: >60 mL/min (ref >60)
GFR calc Af Amer: >60 mL/min (ref >60)
GFR calc Af Amer: >60 mL/min (ref >60)
GFR calc Af Amer: >60 mL/min (ref >60)
GFR calc non Af Amer: >60 mL/min (ref >60)
GFR calc non Af Amer: >60 mL/min (ref >60)
GFR calc non Af Amer: >60 mL/min (ref >60)
GFR calc non Af Amer: >60 mL/min (ref >60)
GFR calc non Af Amer: >60 mL/min (ref >60)
Glucose, Bld: 133 mg/dL — ABNORMAL HIGH (ref 70–99)
Glucose, Bld: 145 mg/dL — ABNORMAL HIGH (ref 70–99)
Glucose, Bld: 157 mg/dL — ABNORMAL HIGH (ref 70–99)
Glucose, Bld: 167 mg/dL — ABNORMAL HIGH (ref 70–99)
Glucose, Bld: 226 mg/dL — ABNORMAL HIGH (ref 70–99)
Potassium: 3.8 mEq/L (ref 3.5–5.1)
Potassium: 4.2 mEq/L (ref 3.5–5.1)
Potassium: 4.2 mEq/L (ref 3.5–5.1)
Sodium: 132 mEq/L — ABNORMAL LOW (ref 135–145)
Sodium: 137 mEq/L (ref 135–145)
Sodium: 137 mEq/L (ref 135–145)
Sodium: 138 mEq/L (ref 135–145)

## 2010-10-01 LAB — URINALYSIS, ROUTINE W REFLEX MICROSCOPIC
Glucose, UA: NEGATIVE mg/dL
Leukocytes, UA: NEGATIVE
pH: 5.5 (ref 5.0–8.0)

## 2010-10-01 LAB — COMPREHENSIVE METABOLIC PANEL
ALT: 27 U/L (ref 0–53)
ALT: 53 U/L (ref 0–53)
AST: 29 U/L (ref 0–37)
AST: 45 U/L — ABNORMAL HIGH (ref 0–37)
Albumin: 2.9 g/dL — ABNORMAL LOW (ref 3.5–5.2)
Calcium: 8.5 mg/dL (ref 8.4–10.5)
Calcium: 9.4 mg/dL (ref 8.4–10.5)
Creatinine, Ser: 0.91 mg/dL (ref 0.4–1.5)
GFR calc Af Amer: >60 mL/min (ref >60)
GFR calc Af Amer: >60 mL/min (ref >60)
Glucose, Bld: 150 mg/dL — ABNORMAL HIGH (ref 70–99)
Glucose, Bld: 98 mg/dL (ref 70–99)
Sodium: 134 mEq/L — ABNORMAL LOW (ref 135–145)
Sodium: 137 mEq/L (ref 135–145)
Total Protein: 6.3 g/dL (ref 6.0–8.3)
Total Protein: 8.3 g/dL (ref 6.0–8.3)

## 2010-10-01 LAB — CULTURE, ROUTINE-ABSCESS: Culture: NO GROWTH

## 2010-10-01 LAB — CARDIAC PANEL(CRET KIN+CKTOT+MB+TROPI)
CK, MB: 1 ng/mL (ref 0.3–4.0)
CK, MB: 1.1 ng/mL (ref 0.3–4.0)
CK, MB: 1.4 ng/mL (ref 0.3–4.0)
Relative Index: 1.2 (ref 0.0–2.5)
Total CK: 118 U/L (ref 7–232)

## 2010-10-01 LAB — HEMOGLOBIN A1C: Hgb A1c MFr Bld: 6.5 % — ABNORMAL HIGH (ref 4.6–6.1)

## 2010-10-01 LAB — CLOSTRIDIUM DIFFICILE EIA

## 2010-10-01 LAB — GRAM STAIN: Gram Stain: NONE SEEN

## 2010-10-01 LAB — SEDIMENTATION RATE: Sed Rate: 110 mm/hr — ABNORMAL HIGH (ref 0–16)

## 2010-10-01 LAB — RPR: RPR Ser Ql: NONREACTIVE

## 2010-10-01 LAB — VITAMIN B12: Vitamin B-12: 874 pg/mL (ref 211–911)

## 2010-10-01 LAB — ANAEROBIC CULTURE

## 2010-10-01 LAB — CULTURE, BLOOD (ROUTINE X 2): Culture: NO GROWTH

## 2010-10-01 LAB — URINE CULTURE: Colony Count: 5000

## 2010-10-01 LAB — URINE MICROSCOPIC-ADD ON

## 2010-10-01 LAB — PREALBUMIN: Prealbumin: 10.9 mg/dL — ABNORMAL LOW (ref 18.0–45.0)

## 2010-10-01 LAB — FOLATE RBC: RBC Folate: 842 ng/mL — ABNORMAL HIGH (ref 180–600)

## 2010-10-01 LAB — CK: Total CK: 143 U/L (ref 7–232)

## 2010-10-01 LAB — VANCOMYCIN, TROUGH: Vancomycin Tr: 17 ug/mL (ref 10.0–20.0)

## 2010-10-01 LAB — PROTIME-INR: INR: 1.4 (ref 0.00–1.49)

## 2010-11-06 NOTE — Consult Note (Signed)
NAME:  Jack Solis, Jack Solis NO.:  [id number]   MEDICAL RECORD NO.:  [id number]          PATIENT TYPE:  INP   LOCATION:  3038                         FACILITY:  [facility]   PHYSICIAN:  Armanda Magic, M.D.     DATE OF BIRTH:  07/06/1933   DATE OF CONSULTATION:  12/12/2008  DATE OF DISCHARGE:                                 CONSULTATION   REFERRING PHYSICIAN:  Kela Millin, MD   CHIEF COMPLAINT:  Atrial fibrillation.   HISTORY OF PRESENT ILLNESS:  This is a very pleasant 77 year old white  male who lives alone.  He was brought to the emergency room via EMS when  he was found by family members being in the bathtub and unable to get  out of bathtub.  He was having a lot of back pain and weakness.  He  denies any history of tachy palpitations or chest pain in the past.  After admission, he was found to have leukocytosis and felt to possibly  be developing dementia.  After tests were run, the patient was found to  have an epidural abscess.  He is on antibiotics for that.  Yesterday,  the patient developed an episode of atrial fibrillation with RVR to 160  beats per minute which lasted for about 5 hours and the patient given IV  Lopressor and digoxin with spontaneous conversion to sinus rhythm.  He  has had no further episodes of atrial fibrillation.  Again, the patient  denies any past history of cardiac illness and denies any palpitations.   PAST MEDICAL HISTORY:  1. Osteoarthritis of the knees.  2. Gout.  3. Presumed hypertension.   MEDICATIONS ON ADMISSION:  1. Celebrex 200 mg b.i.d.  2. Micardis 40 mg a day.  3. Cyclobenzaprine, which was recently started.   ALLERGIES:  PENICILLIN.   SOCIAL HISTORY:  He does not have drinking history or smoking history.  He denies any IV drug use.  His wife died several years ago.  He is  retired from post office several years ago.   FAMILY HISTORY:  Negative for Alzheimer's.  He has a family history of  CAD in his dad and  brother in their 69s.   REVIEW OF SYSTEMS:  Other than what stated in the HPI includes decreased  appetite recently, otherwise negative.   PHYSICAL EXAMINATION:  VITAL SIGNS:  Blood pressure 133/64 to 154/86,  heart rate 80s-90s, respiratory rate 19, and O2 saturations 98% on room  air.  GENERAL:  He is a well-developed, well-nourished male in no acute  distress.  HEENT:  Benign.  NECK:  Supple without lymphadenopathy.  Carotid upstrokes are +2  bilaterally.  No bruits.  LUNGS:  Clear to auscultation throughout.  HEART:  Regular rate and rhythm.  No murmurs, rubs, or gallops.  Normal  S1 and S2.  ABDOMEN:  Soft, nontender, and nondistended.  Normoactive bowel sounds.  No hepatosplenomegaly.  EXTREMITIES:  No edema.   EKG done June 20 showed atrial fibrillation with rapid ventricular  response at 160 beats per minute with nonspecific ST abnormality.  He is  now in  normal sinus rhythm.  EKG on admission showed sinus rhythm with  possible left atrial enlargement.   LABORATORY DATA:  Hemoglobin 9.6, hematocrit 28.7, white cell count  13.1, and platelet count 274.  Sodium 137, potassium 4.4, chloride 103,  bicarb 21, BUN 23, creatinine 1.14, and glucose 198.  INR 1.4.  LFTs are  all normal.  Total bili slightly elevated at 1.7 and albumin 2.9.  Cardiac enzymes negative x3.  ESR 110.  C-reactive protein 32.6.  TSH  0.504.  Chest x-ray shows no active disease.   ASSESSMENT:  1. A very brief paroxysmal atrial fibrillation, new onset, in the      setting of an infectious illness.  The patient is completely      asymptomatic and has had no prior history of atrial fibrillation.      His CHADS score is 1 with history of hypertension.  He is less than      35 years of age.  2. Epidural abscess.  3. Hypertension.   PLAN:  1. We will check a 2-D echocardiogram to rule out structural heart      disease.  2. If 2-D echocardiogram is completely normal, we would continue beta-      blocker  therapy and not start anticoagulation unless he has another      episode of atrial fibrillation.  Once the patient is discharged, we      will get outpatient LifeWatch 30-day monitor to evaluate for silent      arrhythmias.  We would change IV Lopressor to Lopressor 25 mg p.o.      b.i.d. and continue digoxin.  We will follow with you.      Armanda Magic, M.D.  Electronically Signed     Armanda Magic, M.D.  Electronically Signed    TT/MEDQ  D:  12/12/2008  T:  12/13/2008  Job:  [id number]   cc:   Dr. Tiburcio Pea

## 2010-11-06 NOTE — H&P (Signed)
NAME:  Jack Solis, Jack Solis               ACCOUNT NO.:  [id number]   MEDICAL RECORD NO.:  [id number]          PATIENT TYPE:  EMS   LOCATION:  ED                           FACILITY:  [facility]   PHYSICIAN:  Corinna L. Lendell Caprice, MDDATE OF BIRTH:  12/25/1933   DATE OF ADMISSION:  12/07/2008  DATE OF DISCHARGE:                              HISTORY & PHYSICAL   CHIEF COMPLAINT:  Back pain.   HISTORY OF PRESENT ILLNESS:  Jack Solis is a 77 year old white male  who lives alone and was brought to the emergency room via EMS when he  was found by family members to be in the bathtub and unable to get out.  The patient reports that he felt like he was in a dream and could not  move due to pain and weakness.  He reports having gotten into the  bathtub at 9:00 a.m. the day prior to admission.  Apparently a neighbor  checked on him at 9 p.m. when the patient's daughter could not get a  hold of him.  At that time, he was in the bath room with the door closed  and stated he was okay and did not come out.  When the daughter still  was unable to contact the patient this morning, the patient's nephew was  asked to investigate and found the patient stuck in the bathtub.  The  daughter reports that he also seems forgetful saying things multiple  times and becoming confused, forgetting to take his medications for the  past year or so.  He still drives and is fairly active.  He goes to the  [facility] to exercise.  He swam sometime last week, then at about the same  time he began having severe back pain.  He also was started on Flexeril  recently by his primary care physician.  The family reports that there  also was a lot of untouched and old food in the patient's refrigerator.  The patient feels essentially back to his normal mental status.  He  received Percocet in the emergency room for his back pain.  He has  complained of right hip pain.  His pain has improved but he is still  having a difficult time moving in bed  and is unable to get up.  His  daughter lives in [place].  The patient has a brother and nephew who  live here in town.  He reports that he does not usually take a bath but  rather a shower.  The daughter reports that he does not to require the  assistance of a walker or cane and that he is usually quite functional.   PAST MEDICAL HISTORY:  Osteoarthritis, particularly in the knees and  gout, which has been quiescent.  Presumed hypertension (though the  patient denies).   MEDICATIONS:  1. Celebrex 200 mg every 12 hours as needed.  He reports he forgets to      take this.  2. Micardis 40 mg a day.  3. Cyclobenzaprine was started recently and there are 3 tablets      missing from his pill bottle.  ALLERGIES:  There is reported allergy to PENICILLIN.   SOCIAL HISTORY:  He does not have a drinking history, smoking history.  His wife died several years ago.  He retired from the post office  several years ago.   FAMILY HISTORY:  Negative for Alzheimer's.  Otherwise noncontributory.   REVIEW OF SYSTEMS:  CONSTITUTIONAL:  No fevers, chills, weight loss,  weight gain.  HEENT: No headache.  No dysphasia.  RESPIRATORY: No cough,  shortness of breath.  CARDIOVASCULAR: No chest pains, palpitations.  GI:  His appetite has not been great but he reports no significant change.  He has had no nausea, vomiting or diarrhea.  GU: No dysuria or  hematuria.  MUSCULOSKELETAL: As above.  ENDOCRINE:  No diabetes.  PSYCHIATRIC:  He reports that he has been sad since the death of his  wife, but he has no problems sleeping.  He is sometimes tearful.  He  still enjoys working out at [employer]  NEUROLOGIC:  As above.  No history  of stroke or seizure.  Daughter reports that sometimes she has noticed a  tremor in his hands.  SKIN: No rash.   PHYSICAL EXAMINATION:  VITAL SIGNS:  Temperature is 98, blood pressure  127/79, pulse 109, respiratory rate 20, oxygen saturation 99% on room  air.  GENERAL:  The  patient is an elderly white male in no acute distress when  lying still but winces in pain with movement.  HEENT:  Normocephalic, atraumatic.  Pupils equal, round, reactive to  light.  Sclerae nonicteric.  Slightly dry mucous membranes.  Oropharynx  is without erythema or exudate.  NECK:  Supple.  No lymphadenopathy.  No JVD.  No carotid bruits.  LUNGS:  Clear to auscultation bilaterally without wheezes, rhonchi or  rales.  CARDIOVASCULAR:  Regular rate rhythm without murmurs, gallops or rubs.  ABDOMEN:  Soft, nontender, nondistended.  GU/RECTAL:  Deferred.  EXTREMITIES:  No clubbing, cyanosis or edema.  SKIN:  No rash, abrasions ecchymoses contusions, lacerations.  PSYCH:  The patient has a normal affect.  NEUROLOGIC:  Alert and oriented x3.  Cranial nerves are intact.  Sensory  motor exam grossly intact. difficulty fully cooperating due to pain.  MUSCULOSKELETAL:  He has no point tenderness along his spine.  Straight  leg raise negative bilaterally.  She has tophi along his elbows.   LABS:  White blood cell count 15,000, hemoglobin 12.8, hematocrit 37.2  with 89% neutrophils, 8% lymphocytes.  INR 1.4, PTT 38.  Complete  metabolic panel significant for a sodium of 134, glucose 158, BUN 46,  creatinine 1.34, total bilirubin 1.7, albumin 2.9, total CPK is 299,  myoglobin greater than 500 otherwise negative cardiac enzymes.  Urinalysis shows specific gravity 1.022, trace ketones, trace blood, 100  protein, nitrite negative, leukocyte negative, 32 red cells, few  bacteria.  EKG was shows sinus rhythm.  Two views of the chest shows  nothing acute.  Mild degenerative changes of the thoracic spine.  Hip x-  ray on the right shows no fracture or subluxation, right pelvic  phleboliths noted.  CT brain without contrast showed age-related atrophy  nothing acute.  Lumbar spine x-ray shows advanced multilevel  degenerative changes.  No fracture.  Nonobstructive bowel gas pattern.    ASSESSMENT/PLAN:  1. Intractable back pain.  Patient is unable to walk and lives alone.      It sounds as if he is no longer safe to live alone.  He will be  admitted for pain control, physical therapy, occupational therapy      and social work consult.  He may need placement and he should not      drive.  I have discussed this with the daughter who agrees.  The      patient will be on nonsteroidal anti-inflammatories, DVT      precautions.  Monitor CPK pain medications.  Stool softeners.  If      no improvement will consider MRI.  2. Dehydration based on increased BUN to creatinine ratio.  3. Progressive decline in memory with altered mental status yesterday:      This may be dementia.  I will check TSH, B12, folate, RPR for      reversible causes.  4. Leukocytosis.  This will be monitored.  Should he spike a fever      cover with empiric antibiotics.  5. Elevated glucose most likely stress reaction. monitor.  6. Hip pain.  No sign of fracture.  7. Presumed history of hypertension based on medication list, hold      Micardis.  8. Gout.  9. Osteoarthritis.      Corinna L. Lendell Caprice, MD  Electronically Signed     Corinna L. Lendell Caprice, MD  Electronically Signed    CLS/MEDQ  D:  12/07/2008  T:  12/07/2008  Job:  [id number]   cc:   Holley Bouche, M.D.  Fax: [phone]

## 2010-11-06 NOTE — Op Note (Signed)
NAMEDENSON, Jack Solis           ACCOUNT NO.:  [id number]   MEDICAL RECORD NO.:  [id number]          PATIENT TYPE:  INP   LOCATION:  3038                         FACILITY:  [facility]   PHYSICIAN:  Coletta Memos, M.D.     DATE OF BIRTH:  1934-03-25   DATE OF PROCEDURE:  12/09/2008  DATE OF DISCHARGE:  12/15/2008                               OPERATIVE REPORT   PREOPERATIVE DIAGNOSIS:  Lumbar epidural abscess.   POSTOPERATIVE DIAGNOSIS:  Lumbar epidural abscess.   PROCEDURE:  L2, L3, and L4 laminectomy for decompression of spinal  canal.   COMPLICATIONS:  None.   SURGEON:  Coletta Memos, MD   INDICATIONS:  Jack Solis is a 77 year old who presented with severe  pain in his lower back after being found in his home, unable to get out  of his bath tub.  He was confused, forgetting things.  There was  evidence of fluid collection in the right elbow.  He also on MRI had non-  enhancing fluid collection in the lumbar spine.  I felt that this might  represent a spinal epidural abscess.  The patient is brought to the  operating room for decompression.   OPERATIVE NOTE:  Jack Solis was brought to the operating room  intubated, placed under general anesthetic without difficulty.  Rolled  prone onto a Wilson frame and all pressure points properly padded.  His  back was prepped and he was draped in a sterile fashion.  With  intraoperative localization, I was able to identify the L2, L3, and L4  lamina and also L5.  I then proceeded to place self-retaining  retractors.  I then with the use of a Leksell rongeur and a nutcracker  rongeur performed laminectomies at L2, L3, and L4.  I was able to find  grossly purulent material at the L2-L3 space.  I also found thickened  granulation tissue at L4.  I was able to remove the bone and decompress  the spinal canal over these levels.  He had a great deal of spondylitic  change, but the disk spaces were not involved based on the findings in  the MRI  that I needed to do diskectomies.  So, I therefore did not.  I  then irrigated thoroughly.  I sent samples to the laboratory, both  aerobic and anaerobic of bone and of the purulent tissue.  I then  irrigated the wound after thorough decompression and reapproximated the  thoracolumbar subcutaneous and subcuticular layers.  Sterile dressing  was applied.  Jack Solis tolerated the procedure without difficulty  after being extubated and taken back to the Intensive Care Unit.           ______________________________  Coletta Memos, M.D.     KC/MEDQ  D:  12/29/2008  T:  12/30/2008  Job:  [id number]

## 2010-11-06 NOTE — Discharge Summary (Signed)
NAME:  Jack Solis, Jack Solis               ACCOUNT NO.:  [id number]   MEDICAL RECORD NO.:  [id number]          PATIENT TYPE:  INP   LOCATION:  3038                         FACILITY:  [facility]   PHYSICIAN:  Kela Millin, M.D.DATE OF BIRTH:  03-May-1934   DATE OF ADMISSION:  12/08/2008  DATE OF DISCHARGE:  12/14/2008                               DISCHARGE SUMMARY   DISCHARGE DIAGNOSES:  1. Lumbar epidural abscess.  2. Paroxysmal atrial fibrillation, post spontaneous conversion to      normal sinus rhythm.  3. Acute gout, right ankle - in patient with history of gout.  4. Diabetes mellitus - new onset.  5. Diarrhea, clostridium difficile negative, resolved.  6. Hypertension.  7. History of osteoarthritis.   PROCEDURES AND STUDIES:  1. MRI of the lumbar spine - Findings consistent with extensive      epidural or subdural abscess formation.  Abscess creates mass      effect on the distal cord and descending nerve roots.  Marked      distention of the urinary bladder may be due to enlarged prostate      gland and bladder outlet obstruction.  Neurogenic bladder, also      possible.  Multilevel degenerative disease.  2. Right ankle x-ray - soft tissue swelling about the ankle.  Chronic      changes.  3. L2, L3, L4 laminectomy for decompression by Dr. Franky Macho on December 09, 2008.  4. 2-D echocardiogram - Systolic function normal.  Ejection fraction      60-65%, although no diagnostic regional wall motion abnormality was      identified this possibility cannot be completely excluded on the      basis of this study.  Mild mitral regurgitation.  Aortic valve with      no stenosis and no significant regurgitation.   CONSULTATIONS:  1. Neurosurgery - Dr. Sampson Goon. Newell Coral.  2. Cardiology - Dr. Armanda Magic.   BRIEF HISTORY:  The patient is a 77 year old white male with above-  listed medical problems who presented with complaints of back pain.  It  was reported that he was found by  family members in the bathtub and he  was unable to get out.  He stated that he was unable to move due to pain  and weakness.  It was reported that apparently a neighbor had found him  at 9 p.m. when his daughter stated that she could not get hold of him by  phone.  At that time the patient was still in the bathroom with the door  closed and he reported that he had gotten into the bath tub at 9 a.m.  that day.  The patient's daughter also reported that he had seemed to be  more forgetful, sometimes having to repeat the same things and sometimes  forgetting to take his medications.  He reported that he was otherwise  very physically active.  He had gone swimming the week prior to onset of  these symptoms and about the same time he began having severe back pain.  He recently had  been started on Flexeril by his primary care physician.  In the ED the patient felt essentially back to his normal mental status.  He was given Percocet for his back pain.  He complained of right hip  pain.  Even after he reported that the pain had improved, after the pain  medications, he still was having a difficult time moving in bed and was  unable to get up.  He was admitted for further evaluation and  management.   Please see the full admission history and physical dictated on December 07, 2008 by Dr. Lendell Caprice for the details of the admission physical exam as  well as the laboratory data.   HOSPITAL COURSE BY PROBLEM:  1. Lumbar epidural abscess - upon admission the patient was started on      anti-inflammatories and muscle relaxants as well as narcotics for      pain management.  An MRI of the lumbar spine was done the next day      and the results as stated above, consistent with a lumbar epidural      abscess.  Following this diagnosis, neurosurgery was consulted and      the patient was transferred to the neuro ICU and also infectious      disease was consulted.  The patient was then placed on IV       vancomycin and Rocephin.  Neurosurgery saw the patient and on December 09, 2008 he was taken to the OR and L2-3, L3-4 laminectomy for      decompression was done.  Following surgery was noted that the      patient was alert and oriented x4.  Speech clear and fluent and      that he was moving his lower extremities well, and that the wound      was clean and dry.  He also had blood cultures drawn after the MRI      showed the epidural abscess.  The blood cultures to date showed no      growth.  Infectious disease followed the patient and Dr. Daiva Eves      final recommendations at that the patient be treated with      vancomycin and Rocephin for a total of 8 weeks - the antibiotics      were started on December 09, 2008.  He is to have a recheck MRI in 6      weeks and follow up with infectious disease in the ID Clinic prior      to stopping the antibiotics.  The patient has remained afebrile and      hemodynamically stable.  His last white cell count prior to      discharge today is 11.1.  He has been mentating well throughout his      hospital stay.  2. Paroxysmal atrial fibrillation - the patient had no prior history      of atrial fibrillation, but on the night of December 10, 2008 he went      into atrial fibrillation with rapid ventricular response.  He was      placed on IV metoprolol, but by the next day he was still      tachycardiac.  He was then loaded with digoxin and started on a      maintenance dose.  With this he spontaneously converted to normal      sinus rhythm by December 12, 2008.  Serial cardiac enzymes were  done to      evaluate for myocardial infarction and these were negative.  A 2-D      echocardiogram was done and it showed the left ventricular systolic      function was normal ejection fraction 60-65%.  No diagnostic      regional wall motion abnormality was identified, but it was      indicated that this possibility could not be completely excluded on      the basis of this  study.  There was no aortic valve stenosis and no      significant regurgitation noted.  The aorta was also noted to be      normal, not dilated, and non-diseased.  Cardiology was consulted      and Dr. Mayford Knife saw the patient and recommended to continue beta      blocker therapy and digoxin.  She also recommended not to start      anticoagulation unless he had another episode of atrial      fibrillation.  The patient has had no other documented episodes of      atrial fibrillation.  Cardiology followed up and stated that they      will come over to the rehab facility and set the patient up for      LifeWatch 30-day monitor to evaluate for silent arrhythmias.  The 2-      D echocardiogram results are as discussed.  3. Diabetes mellitus, new onset - upon admission the patient was noted      to have elevated blood glucose of 150.  The initial impression was      that this was possibly secondary to a stress reaction.  On further      monitoring, the patient had more than 2 subsequent fasting blood      sugars that were greater than 126 - 167, 169.  A hemoglobin A1c was      done and it was elevated at 6.5.  The patient was then placed on a      sliding scale insulin.  He will be discharged at this time on      Amaryl 2 mg p.o. daily with close monitoring of his blood sugars      per rehab/LTAC physician, for further adjustment of his medications      as appropriate for optimal blood glucose control.  4. Hypertension - The patient was on Micardis prior to admission and      this was resumed once he was able to tolerate p.o. following his      surgery and he is to continue this upon discharge.  5. Acute gout - the patient developed right ankle pain and on exam it      was tender and edematous.  An x-ray of that ankle was done and the      results as stated above.  The patient was started on colchicine and      with that he had some moderate improvement in his symptoms.      Subsequently, he was  given intermittent steroids and with that his      pain improved significantly.  With the colchicine he developed some      diarrhea and so his dose was decreased to once daily.  He is to      continue this upon discharge.  6. Diarrhea - As already mentioned above, the patient developed      diarrhea after he was started on colchicine.  Clostridium difficile      studies were obtained given that he is also on antibiotics.  The      clostridium difficile was negative x2.  The impression was that his      diarrhea was secondary to the colchicine and he improved/resolved      after the dose was decreased.  7. History of osteoarthritis - The patient was previously on Celebrex,      but this was held during this hospital stay.   DISCHARGE MEDICATIONS:  1. Rocephin 2 grams IV q.12 hours, and this dose was started on December 13, 2008, the patient is to receive a total of 8 weeks of      antibiotics as already mentioned.  2. Vancomycin 750 mg IV q.12 hours started on December 09, 2008.  The      patient is to receive a total of 8 weeks of this antibiotic.  His      iron levels are to be monitored at [facility] and the dose adjusted as      appropriate.  3. The patient is to continue Micardis 40 mg p.o. daily.  4. Lopressor 50 mg p.o. b.i.d.  5. Digoxin 0.125 mg p.o. daily.  6. Colchicine 0.6 mg p.o. daily.  7. Protonix 40 mg p.o. daily.  8. Florastor 250 mg p.o. b.i.d.  9. Tylenol 650 mg p.o. q.4-6 h p.r.n.  10.Tessalon Perles 100 mg p.o. t.i.d. p.r.n.  11.Valium 5 mg p.o. q.6 h p.r.n.  12.Robaxin 500 mg p.o. q.8 h p.r.n.  13.Oxycodone 5 mg p.o. q.4 h p.r.n.  14.Amaryl 2 mg p.o. daily.  15.Sliding scale insulin, sensitive.   SPECIAL INSTRUCTIONS:  Monitor Accu-Chek q.a.c. and q.h.s.   FOLLOW-UP CARE:  1. Physician at long term acute care facility.  2. The Infectious Disease Clinic at [facility] with Dr. Daiva Eves before      his antibiotics are discontinued.  Also the patient is to have a       followup MRI in 6 weeks.  3. [facility] is to see the patient at the long-term acute care      facility and place/set up for the 30-day LifeWatch monitor.   DISCHARGE CONDITION:  Improved/Stable.      Kela Millin, M.D.  Electronically Signed     Kela Millin, M.D.  Electronically Signed    ACV/MEDQ  D:  12/14/2008  T:  12/14/2008  Job:  [id number]   cc:   Holley Bouche, M.D.  Armanda Magic, M.D.  Coletta Memos, M.D.

## 2010-11-09 NOTE — Op Note (Signed)
NAME:  SAMI, FROH NO.:  [id number]   MEDICAL RECORD NO.:  [id number]          PATIENT TYPE:  AMB   LOCATION:  ENDO                         FACILITY:  [facility]   PHYSICIAN:  Graylin Shiver, M.D.   DATE OF BIRTH:  16-Sep-1933   DATE OF PROCEDURE:  04/02/2004  DATE OF DISCHARGE:                                 OPERATIVE REPORT   PROCEDURE:  Colonoscopy.   INDICATIONS:  Screening.   Informed consent was obtained after explanation of the risks of bleeding,  infection, and perforation.   PREMEDICATION:  Fentanyl 50 mcg IV, Versed 5 mg IV.   PROCEDURE:  With the patient in the left lateral decubitus position, a  rectal exam was performed, no masses were felt.  The Olympus colonoscope was  introduced into the rectum and advanced around the colon to the cecum.  Cecal landmarks were identified.  The cecum and ascending colon were normal,  the transverse colon normal.  The descending colon and sigmoid showed a few  diverticula.  The rectum was normal.  He tolerated the procedure well  without complications.   IMPRESSION:  Diverticulosis of the left colon.   I would recommend a follow-up screening colonoscopy again in 10 years.       SFG/MEDQ  D:  04/02/2004  T:  04/02/2004  Job:  [id number]   cc:   Juluis Mire, M.D.  [address]  [place]  [place] [address]  Fax: [phone]

## 2011-01-23 IMAGING — CT CT HEAD W/O CM
1 of 2 series · 16 of 30 positions shown, 20 images · non-contrast
Comparison: None

CLINICAL DATA: Pain

CT HEAD WITHOUT CONTRAST
TECHNIQUE: Contiguous axial images were obtained from the base of
the skull through the vertex without contrast.

[Series 2: head_seq 4.5 h37s st · axial · 0.43mm/px · z∈[+1250,+1376]mm · 16 of 32 slices shown, 20 images]
[im 2/32  brain]
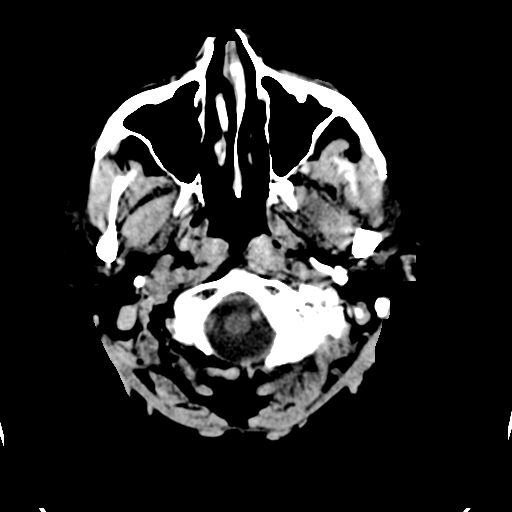
[im 2/32  bone]
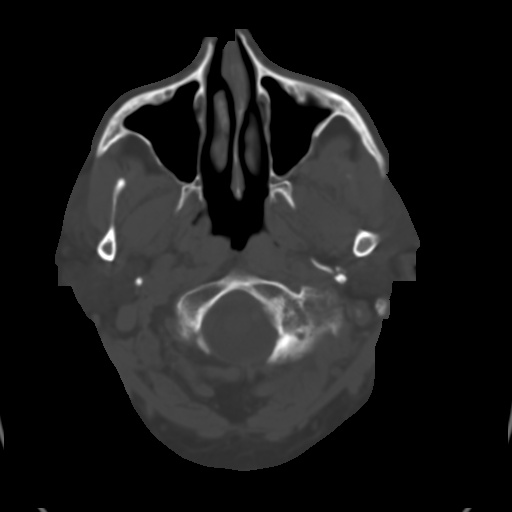
[im 4/32  brain]
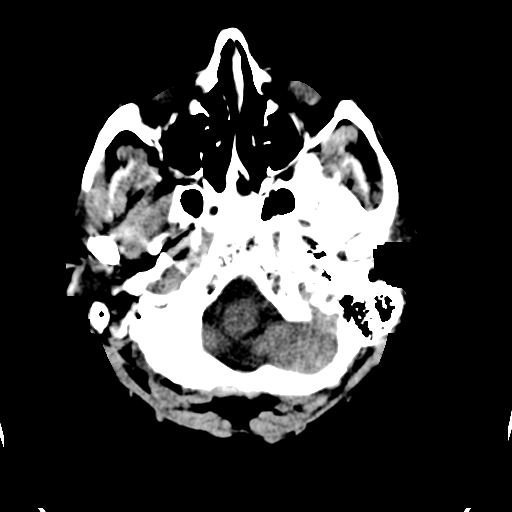
[im 5/32  brain]
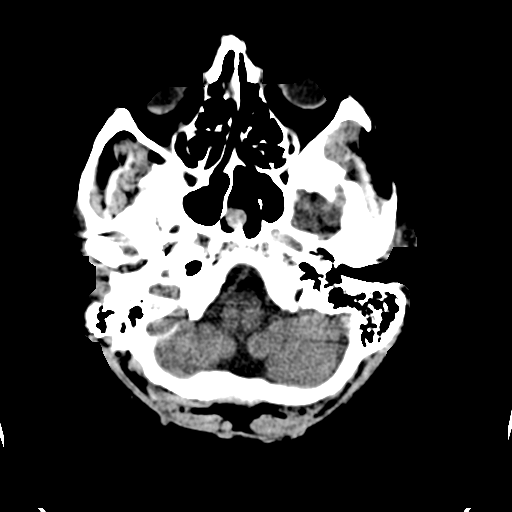
[im 7/32  brain]
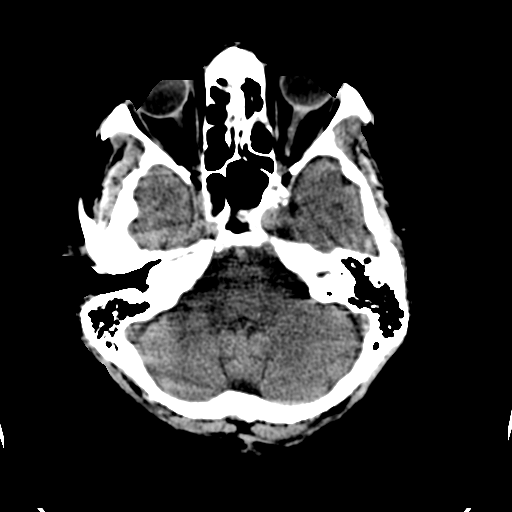
[im 10/32  brain]
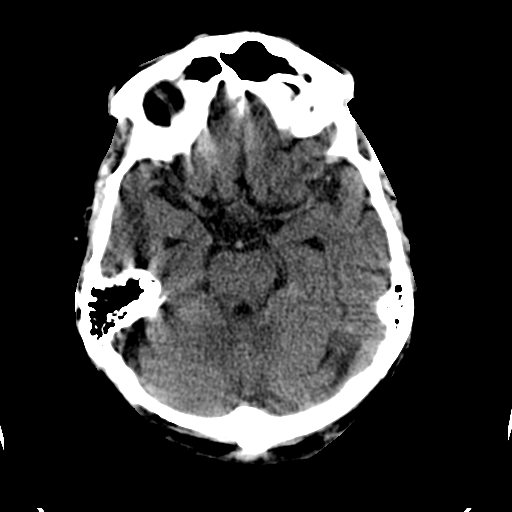
[im 10/32  bone]
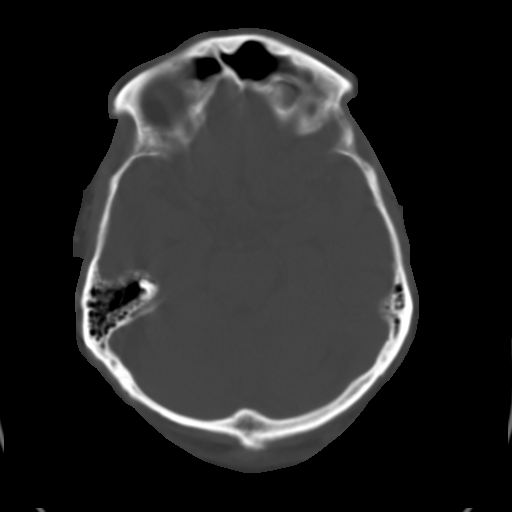
[im 12/32  brain]
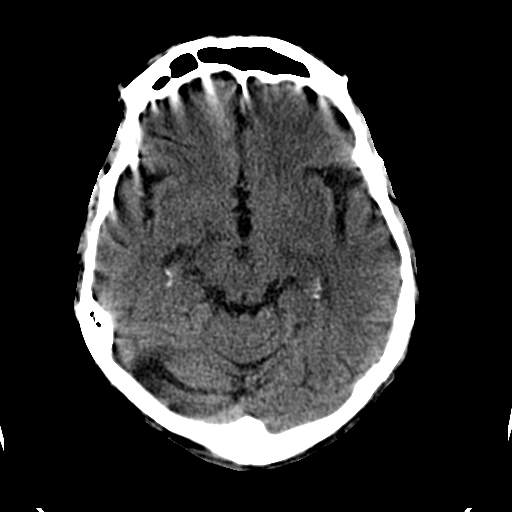
[im 14/32  brain]
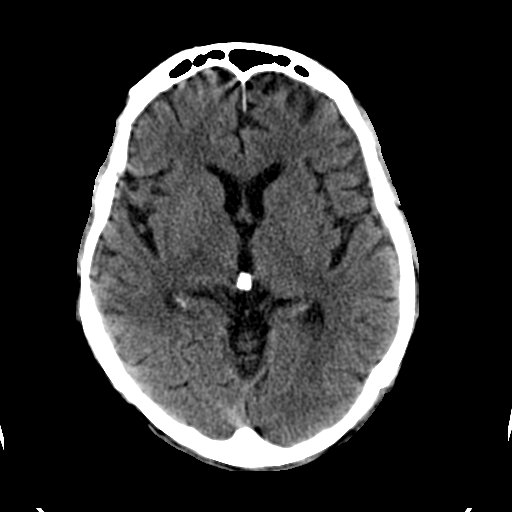
[im 15/32  brain]
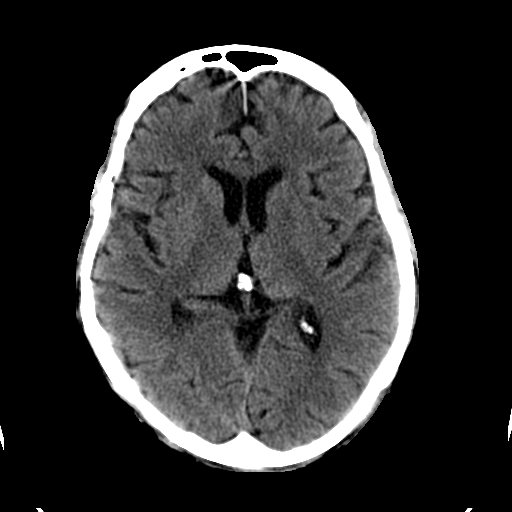
[im 17/32  brain]
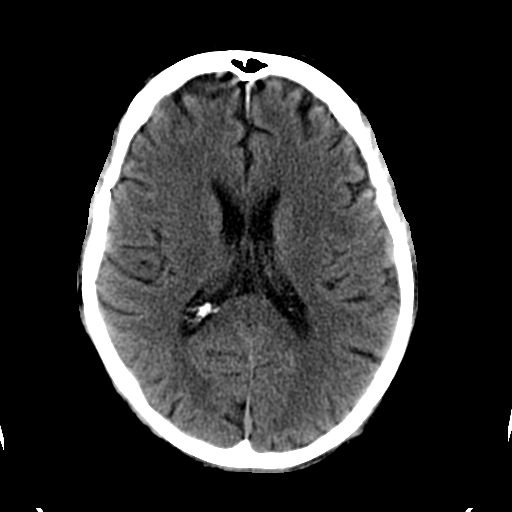
[im 17/32  bone]
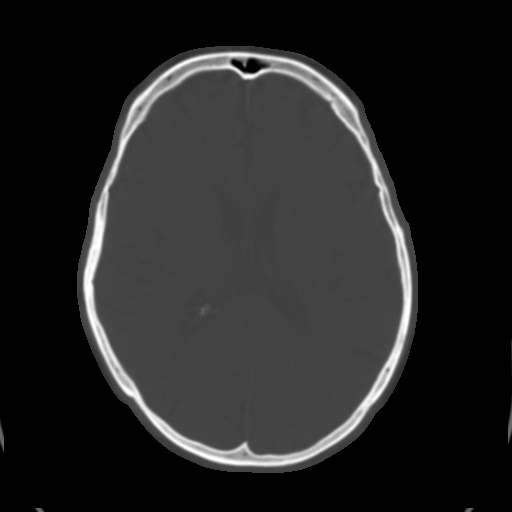
[im 18/32  brain]
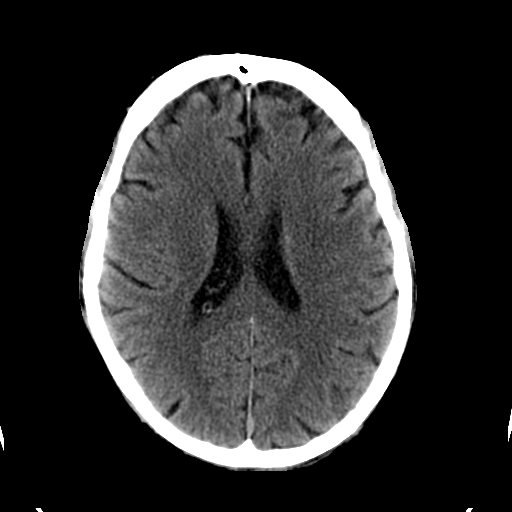
[im 20/32  brain]
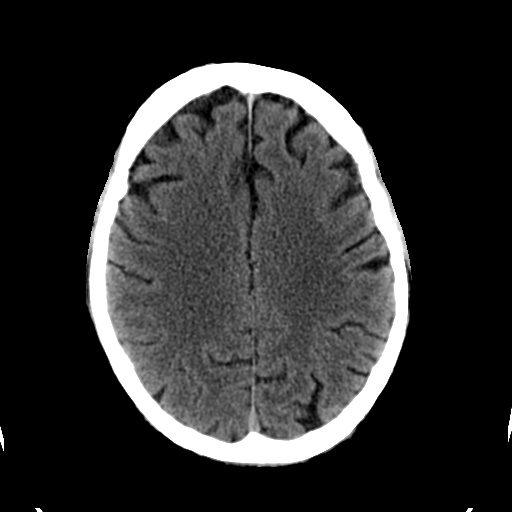
[im 22/32  brain]
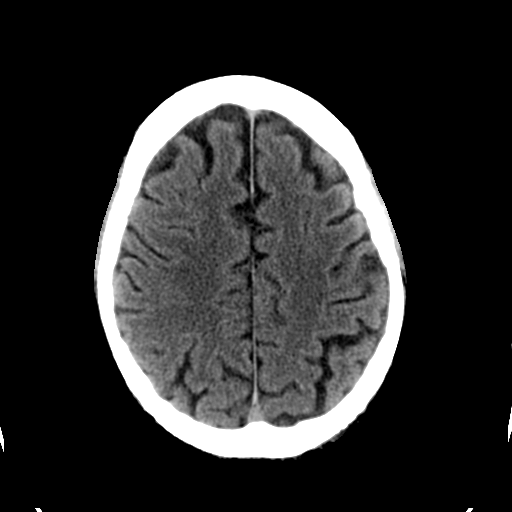
[im 25/32  brain]
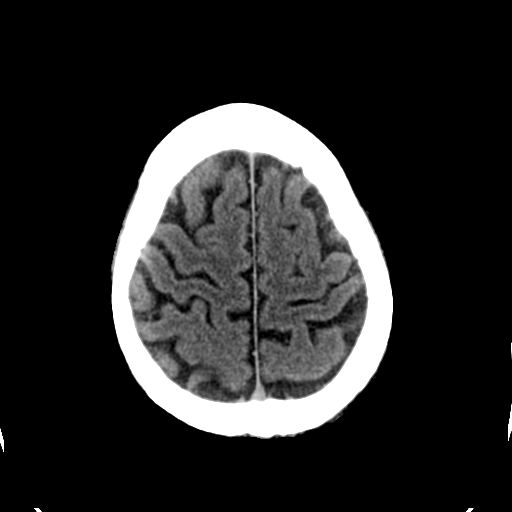
[im 25/32  bone]
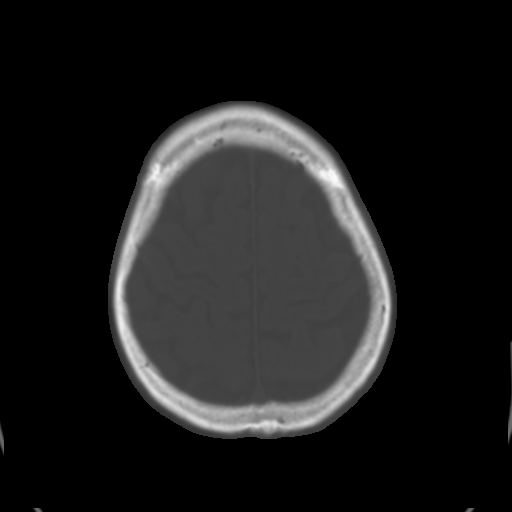
[im 27/32  brain]
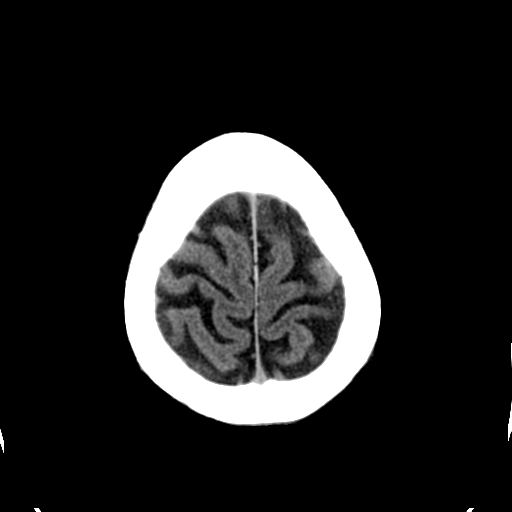
[im 28/32  brain]
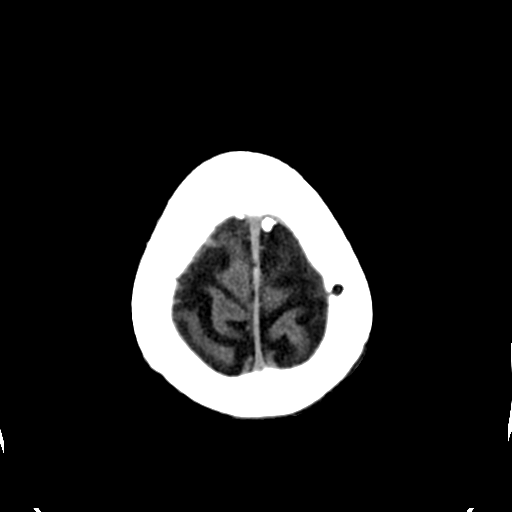
[im 30/32  brain]
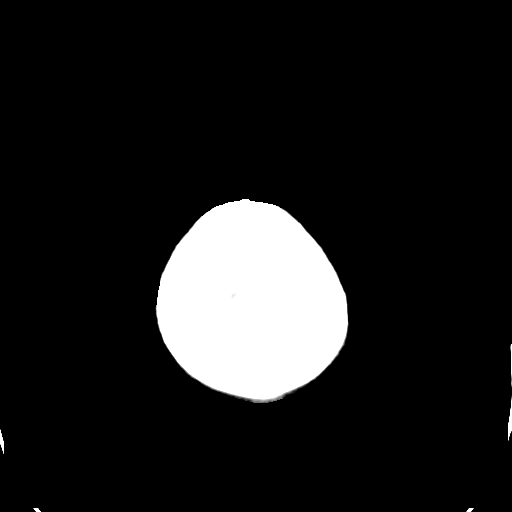

[16 of 30 positions shown; findings below may reference images not displayed]

FINDINGS: Age related atrophy.  Ventricles and basilar cisterns are
midline.  There is no effacement or mass effect.  No extraaxial
fluid collections.  No fracture.
IMPRESSION: Negative for acute intracranial hemorrhage, acute infarction or
mass lesions.

## 2011-04-28 IMAGING — CR DG CHEST 2V
2 series · 2 of 2 positions shown · non-contrast
Comparison: 12/12/2008

CLINICAL DATA: New onset atrial fibrillation.  Pneumonia protocol.
Heart palpitations.  Tachycardia.

CHEST - 2 VIEW

[w chest pa]
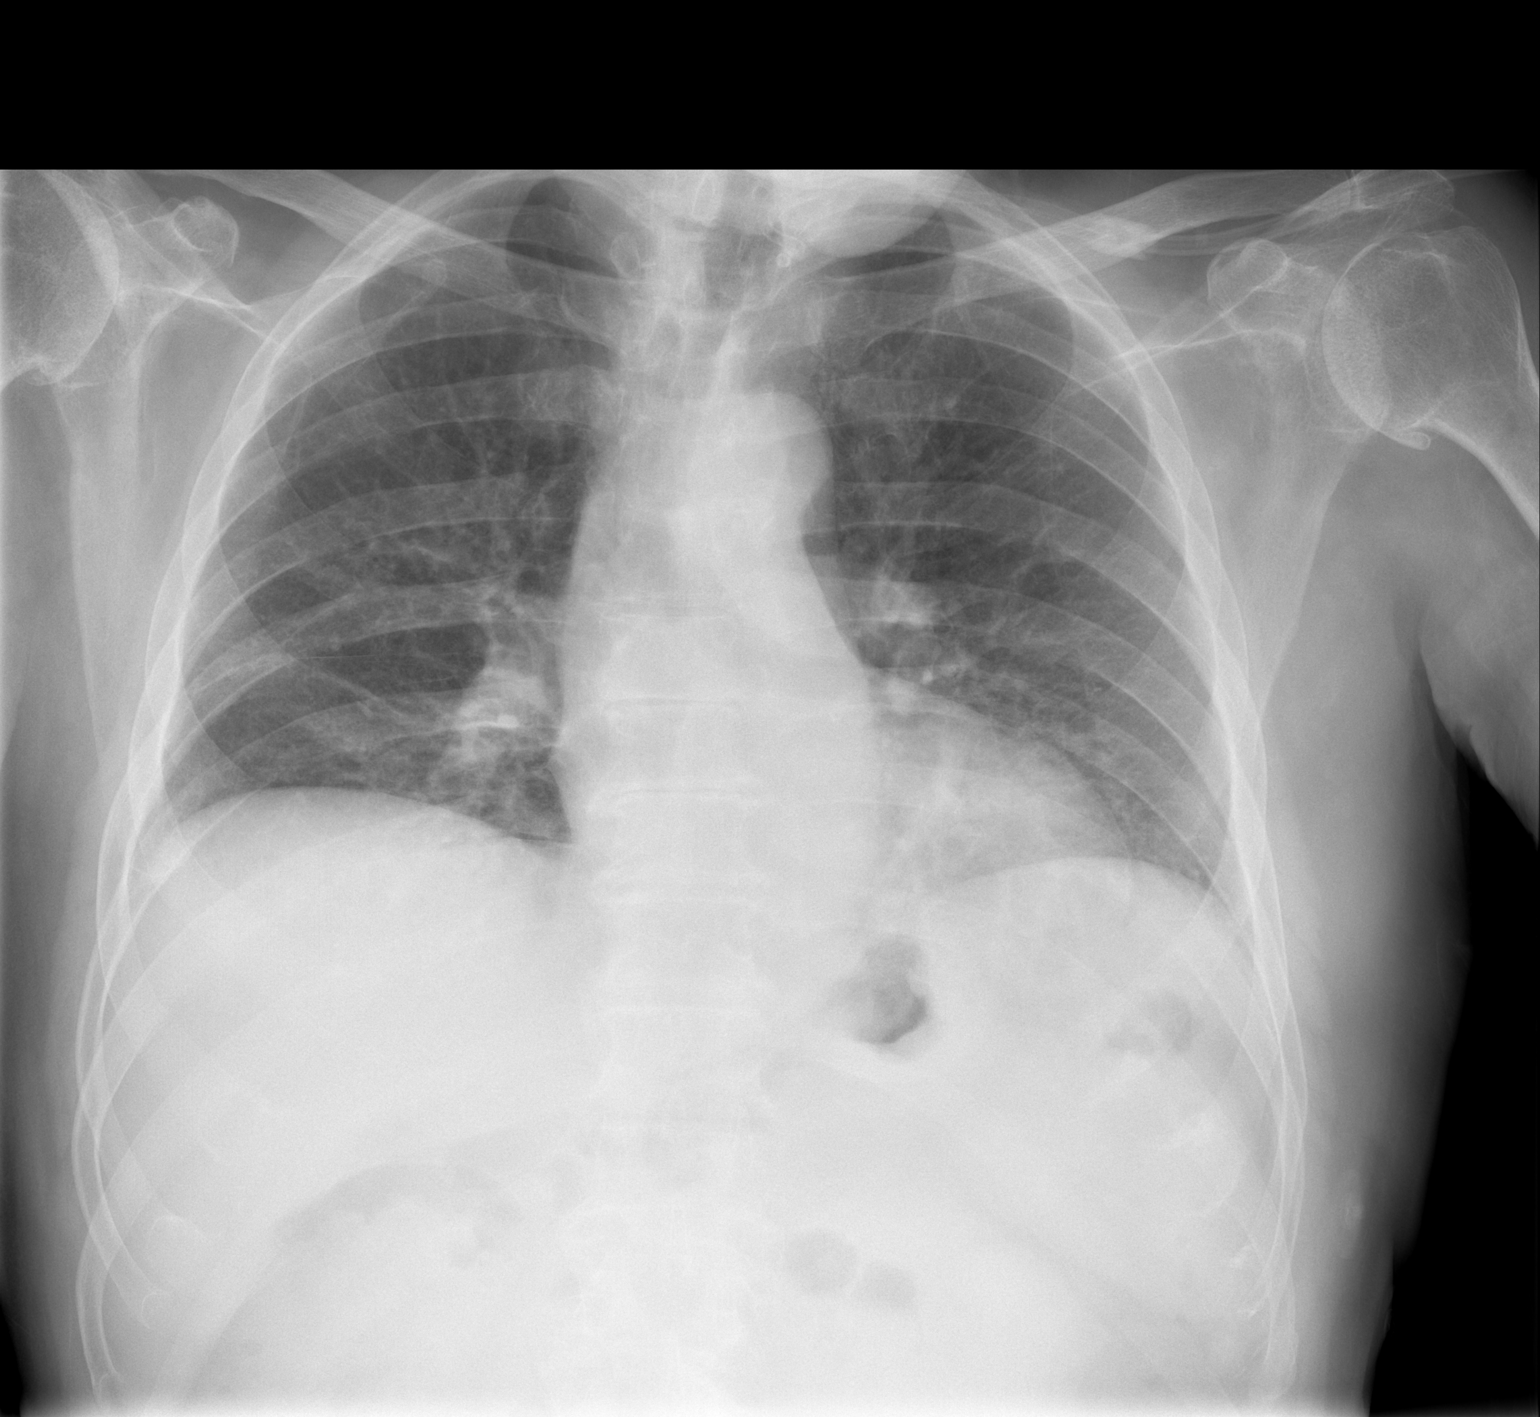

[w chest lat]
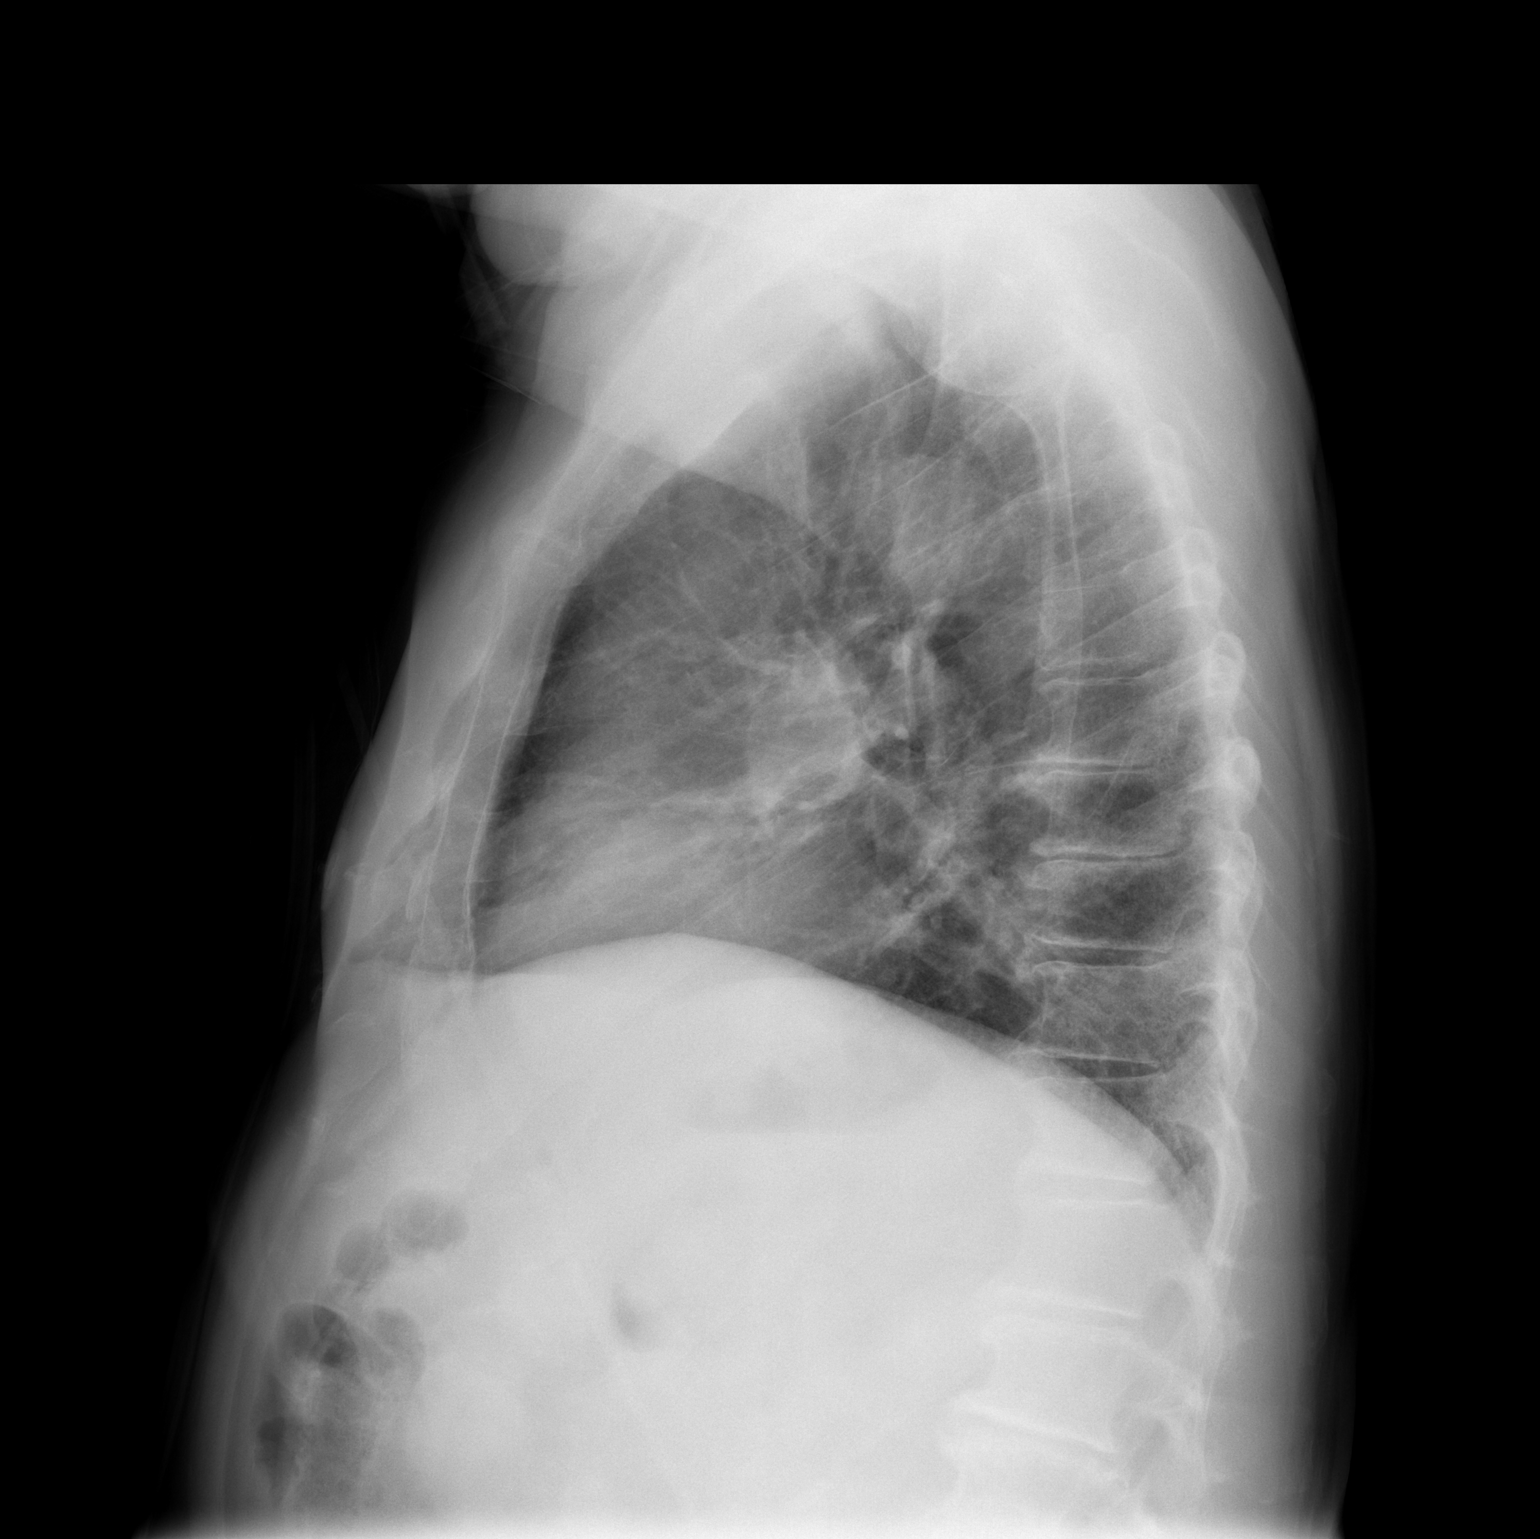

[2 of 2 positions shown; findings below may reference images not displayed]

FINDINGS: Film is made with shallow inflation.  Heart size is upper
limits normal.  There is bibasilar atelectasis.  No focal
consolidations or pleural effusions are identified.  Degenerative
changes are seen in the spine.
IMPRESSION: Bibasilar subsegmental atelectasis or early infiltrate.

## 2011-04-29 IMAGING — CR DG CHEST 1V PORT
1 series · 1 of 1 positions shown · non-contrast
Comparison: 03/12/2009

CLINICAL DATA: Left central line placement.

PORTABLE CHEST - 1 VIEW

[AP]
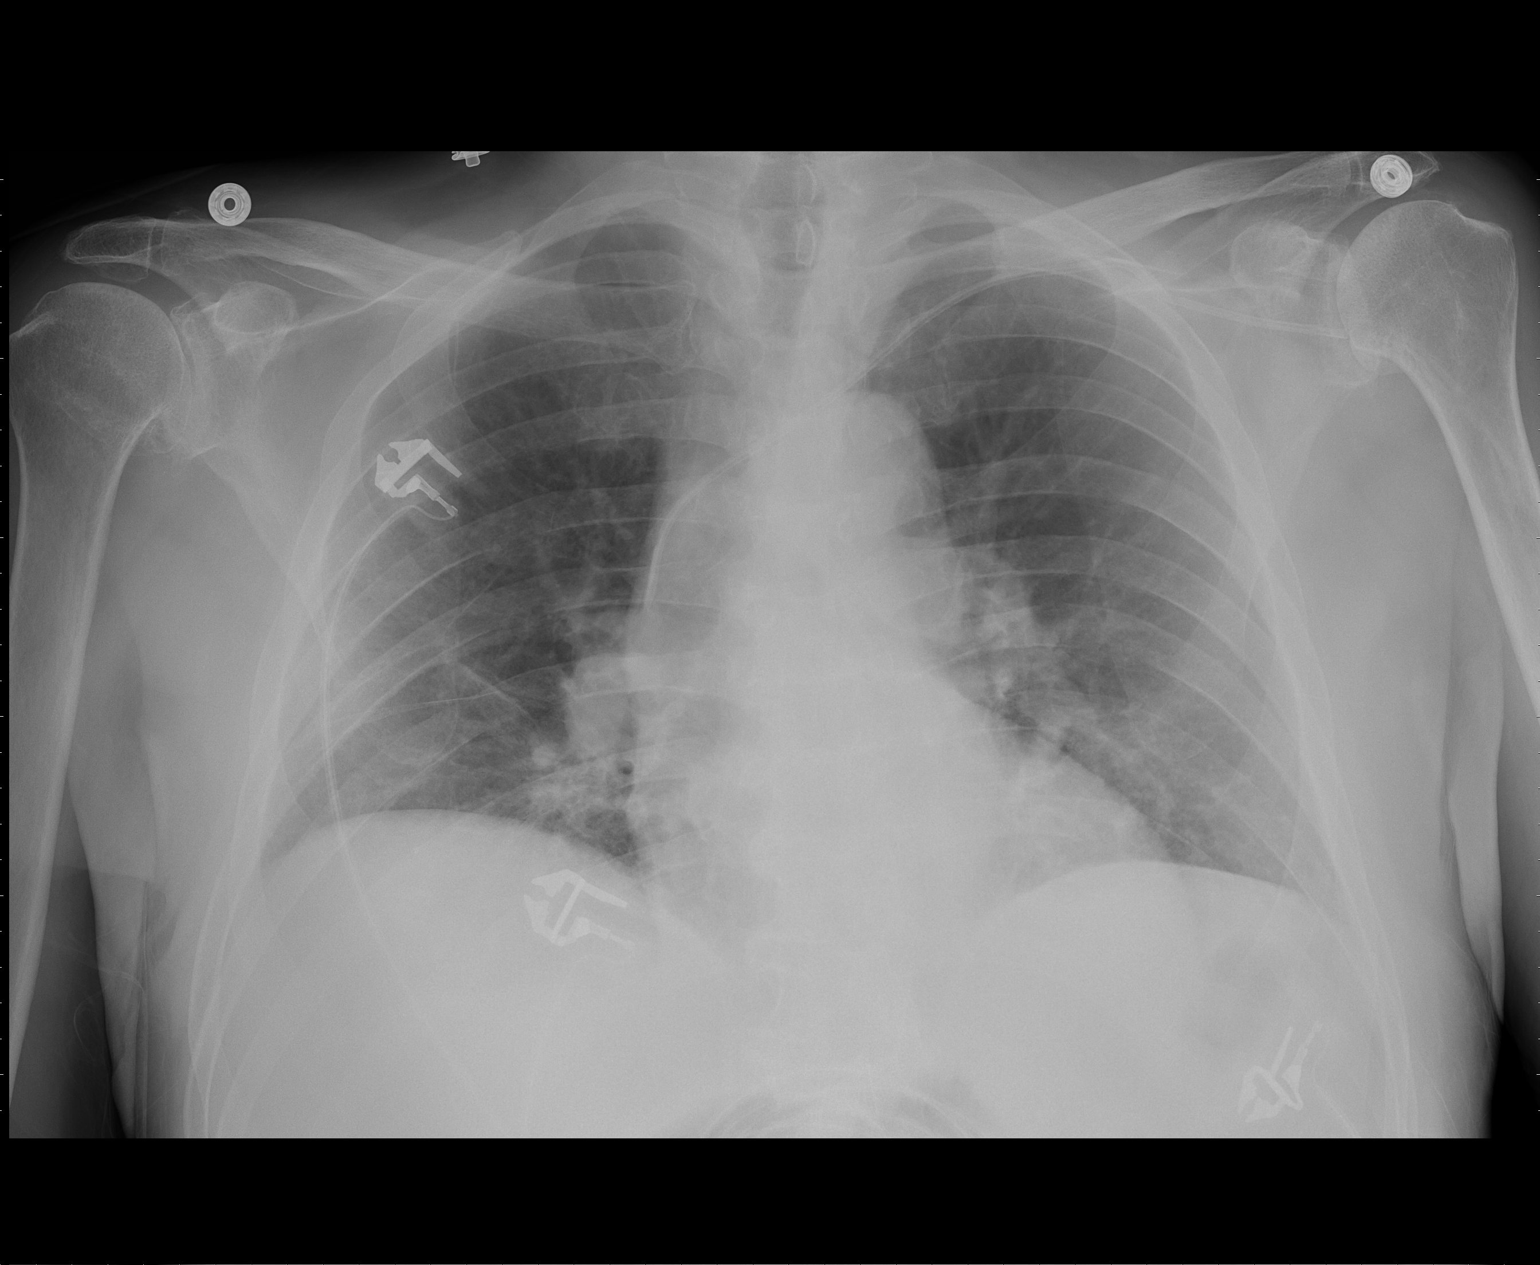

[1 of 1 positions shown; findings below may reference images not displayed]

FINDINGS: Trachea is midline.  Heart size normal.  Left subclavian
central line tip projects over the SVC.  No pneumothorax.  Lungs
are low in volume with bibasilar atelectasis.
IMPRESSION: 1.  Left subclavian central line placement without pneumothorax.
2.  Low lung volumes with bibasilar atelectasis.

## 2011-04-30 IMAGING — CR DG CHEST 1V PORT
1 series · 1 of 1 positions shown · non-contrast
Comparison: 03/13/2009

CLINICAL DATA: Respiratory distress.

PORTABLE CHEST - 1 VIEW 6726 hours

[view not recorded]
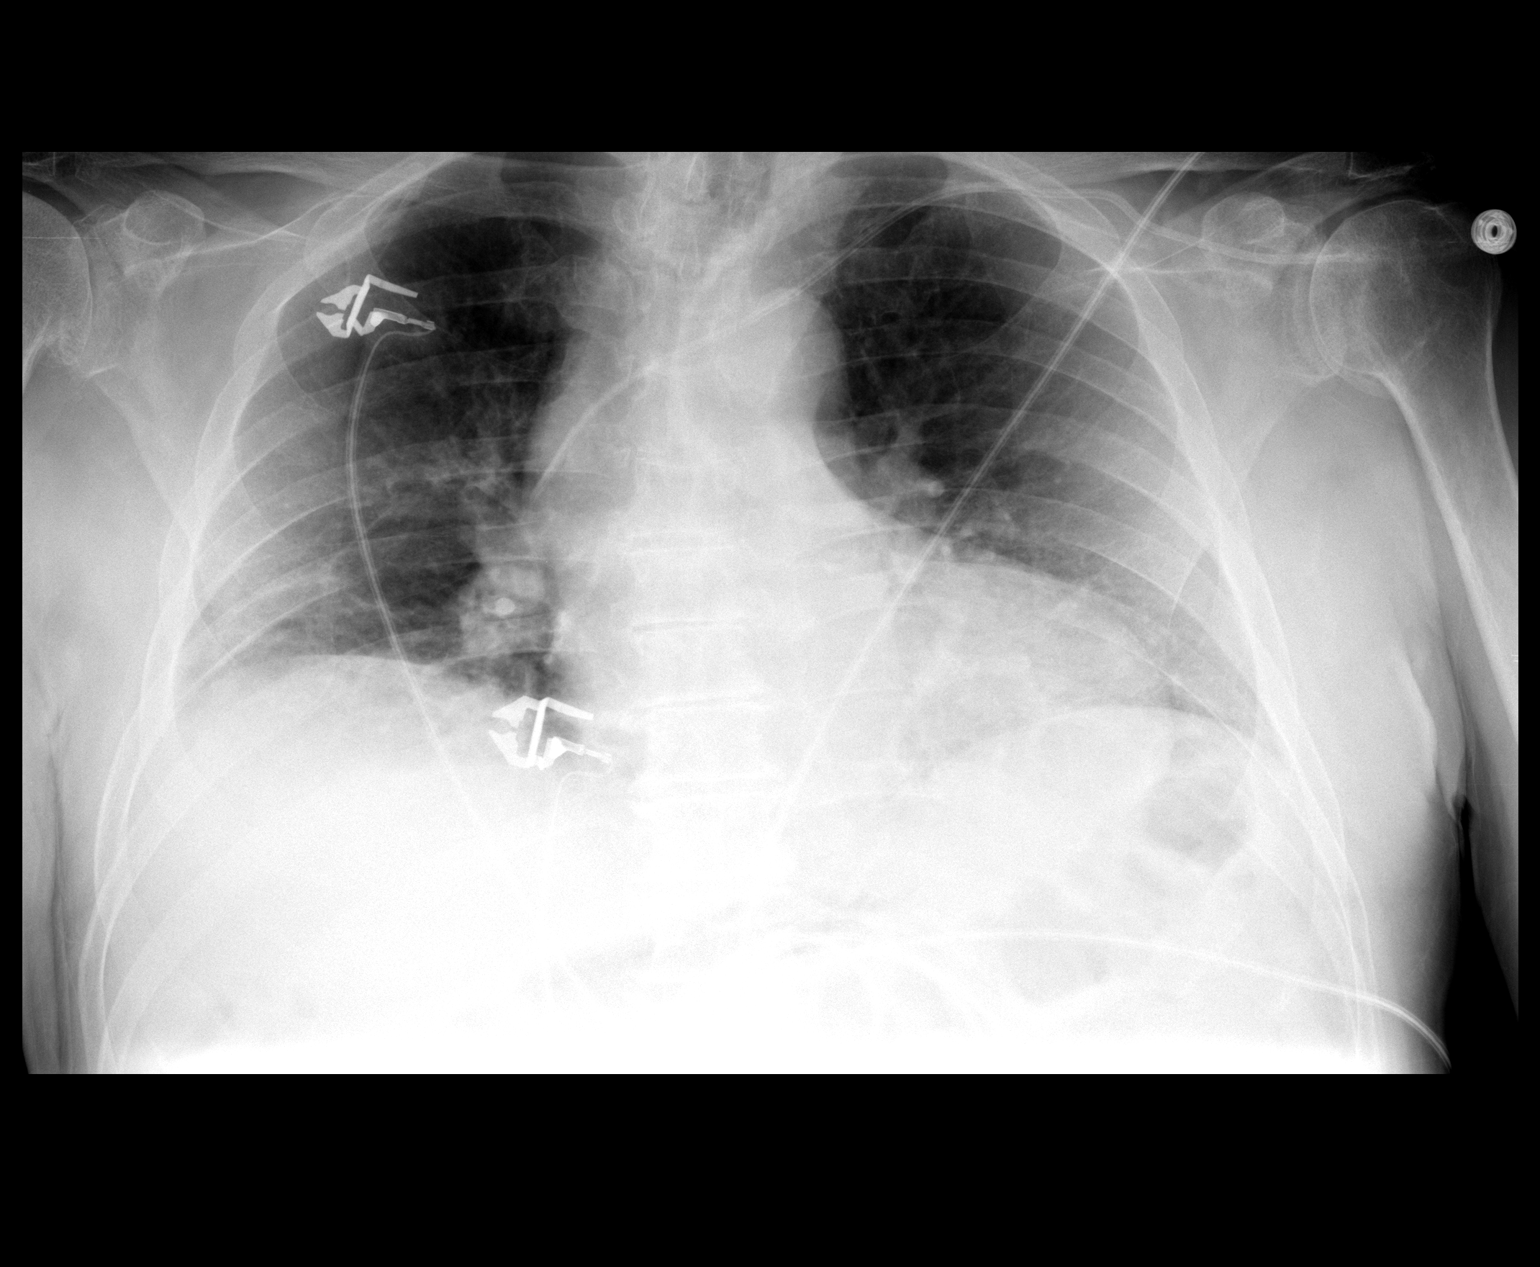

[1 of 1 positions shown; findings below may reference images not displayed]

FINDINGS: Low lung volumes and mild atelectasis at the bases.  No
significant change.  Left subclavian CVC is in the SVC.
IMPRESSION: Mild atelectasis at the bases.  No interval change.

## 2011-07-18 DIAGNOSIS — R5381 Other malaise: Secondary | ICD-10-CM | POA: Diagnosis not present

## 2011-07-18 DIAGNOSIS — R5383 Other fatigue: Secondary | ICD-10-CM | POA: Diagnosis not present

## 2011-07-18 DIAGNOSIS — R7989 Other specified abnormal findings of blood chemistry: Secondary | ICD-10-CM | POA: Diagnosis not present

## 2011-07-18 DIAGNOSIS — F329 Major depressive disorder, single episode, unspecified: Secondary | ICD-10-CM | POA: Diagnosis not present

## 2011-07-18 DIAGNOSIS — I10 Essential (primary) hypertension: Secondary | ICD-10-CM | POA: Diagnosis not present

## 2012-01-01 DIAGNOSIS — M6250 Muscle wasting and atrophy, not elsewhere classified, unspecified site: Secondary | ICD-10-CM | POA: Diagnosis not present

## 2012-01-01 DIAGNOSIS — M6281 Muscle weakness (generalized): Secondary | ICD-10-CM | POA: Diagnosis not present

## 2012-01-01 DIAGNOSIS — R269 Unspecified abnormalities of gait and mobility: Secondary | ICD-10-CM | POA: Diagnosis not present

## 2012-01-02 DIAGNOSIS — R269 Unspecified abnormalities of gait and mobility: Secondary | ICD-10-CM | POA: Diagnosis not present

## 2012-01-02 DIAGNOSIS — M6250 Muscle wasting and atrophy, not elsewhere classified, unspecified site: Secondary | ICD-10-CM | POA: Diagnosis not present

## 2012-01-02 DIAGNOSIS — M6281 Muscle weakness (generalized): Secondary | ICD-10-CM | POA: Diagnosis not present

## 2012-01-03 DIAGNOSIS — M6250 Muscle wasting and atrophy, not elsewhere classified, unspecified site: Secondary | ICD-10-CM | POA: Diagnosis not present

## 2012-01-03 DIAGNOSIS — R269 Unspecified abnormalities of gait and mobility: Secondary | ICD-10-CM | POA: Diagnosis not present

## 2012-01-03 DIAGNOSIS — M6281 Muscle weakness (generalized): Secondary | ICD-10-CM | POA: Diagnosis not present

## 2012-01-07 DIAGNOSIS — M6281 Muscle weakness (generalized): Secondary | ICD-10-CM | POA: Diagnosis not present

## 2012-01-07 DIAGNOSIS — R269 Unspecified abnormalities of gait and mobility: Secondary | ICD-10-CM | POA: Diagnosis not present

## 2012-01-07 DIAGNOSIS — M6250 Muscle wasting and atrophy, not elsewhere classified, unspecified site: Secondary | ICD-10-CM | POA: Diagnosis not present

## 2012-01-08 DIAGNOSIS — R269 Unspecified abnormalities of gait and mobility: Secondary | ICD-10-CM | POA: Diagnosis not present

## 2012-01-08 DIAGNOSIS — M6281 Muscle weakness (generalized): Secondary | ICD-10-CM | POA: Diagnosis not present

## 2012-01-08 DIAGNOSIS — M6250 Muscle wasting and atrophy, not elsewhere classified, unspecified site: Secondary | ICD-10-CM | POA: Diagnosis not present

## 2012-01-09 DIAGNOSIS — M6250 Muscle wasting and atrophy, not elsewhere classified, unspecified site: Secondary | ICD-10-CM | POA: Diagnosis not present

## 2012-01-09 DIAGNOSIS — R269 Unspecified abnormalities of gait and mobility: Secondary | ICD-10-CM | POA: Diagnosis not present

## 2012-01-09 DIAGNOSIS — M6281 Muscle weakness (generalized): Secondary | ICD-10-CM | POA: Diagnosis not present

## 2012-01-10 DIAGNOSIS — R269 Unspecified abnormalities of gait and mobility: Secondary | ICD-10-CM | POA: Diagnosis not present

## 2012-01-10 DIAGNOSIS — M6250 Muscle wasting and atrophy, not elsewhere classified, unspecified site: Secondary | ICD-10-CM | POA: Diagnosis not present

## 2012-01-10 DIAGNOSIS — M6281 Muscle weakness (generalized): Secondary | ICD-10-CM | POA: Diagnosis not present

## 2012-01-13 DIAGNOSIS — M6281 Muscle weakness (generalized): Secondary | ICD-10-CM | POA: Diagnosis not present

## 2012-01-13 DIAGNOSIS — M6250 Muscle wasting and atrophy, not elsewhere classified, unspecified site: Secondary | ICD-10-CM | POA: Diagnosis not present

## 2012-01-13 DIAGNOSIS — R269 Unspecified abnormalities of gait and mobility: Secondary | ICD-10-CM | POA: Diagnosis not present

## 2012-01-15 DIAGNOSIS — M6281 Muscle weakness (generalized): Secondary | ICD-10-CM | POA: Diagnosis not present

## 2012-01-15 DIAGNOSIS — R269 Unspecified abnormalities of gait and mobility: Secondary | ICD-10-CM | POA: Diagnosis not present

## 2012-01-15 DIAGNOSIS — M6250 Muscle wasting and atrophy, not elsewhere classified, unspecified site: Secondary | ICD-10-CM | POA: Diagnosis not present

## 2012-01-16 DIAGNOSIS — M6281 Muscle weakness (generalized): Secondary | ICD-10-CM | POA: Diagnosis not present

## 2012-01-16 DIAGNOSIS — M6250 Muscle wasting and atrophy, not elsewhere classified, unspecified site: Secondary | ICD-10-CM | POA: Diagnosis not present

## 2012-01-16 DIAGNOSIS — R269 Unspecified abnormalities of gait and mobility: Secondary | ICD-10-CM | POA: Diagnosis not present

## 2012-01-17 DIAGNOSIS — M6250 Muscle wasting and atrophy, not elsewhere classified, unspecified site: Secondary | ICD-10-CM | POA: Diagnosis not present

## 2012-01-17 DIAGNOSIS — M6281 Muscle weakness (generalized): Secondary | ICD-10-CM | POA: Diagnosis not present

## 2012-01-17 DIAGNOSIS — R269 Unspecified abnormalities of gait and mobility: Secondary | ICD-10-CM | POA: Diagnosis not present

## 2012-01-20 DIAGNOSIS — M6250 Muscle wasting and atrophy, not elsewhere classified, unspecified site: Secondary | ICD-10-CM | POA: Diagnosis not present

## 2012-01-20 DIAGNOSIS — M6281 Muscle weakness (generalized): Secondary | ICD-10-CM | POA: Diagnosis not present

## 2012-01-20 DIAGNOSIS — R269 Unspecified abnormalities of gait and mobility: Secondary | ICD-10-CM | POA: Diagnosis not present

## 2012-01-22 DIAGNOSIS — R269 Unspecified abnormalities of gait and mobility: Secondary | ICD-10-CM | POA: Diagnosis not present

## 2012-01-22 DIAGNOSIS — M6250 Muscle wasting and atrophy, not elsewhere classified, unspecified site: Secondary | ICD-10-CM | POA: Diagnosis not present

## 2012-01-22 DIAGNOSIS — M6281 Muscle weakness (generalized): Secondary | ICD-10-CM | POA: Diagnosis not present

## 2012-01-27 DIAGNOSIS — R269 Unspecified abnormalities of gait and mobility: Secondary | ICD-10-CM | POA: Diagnosis not present

## 2012-01-27 DIAGNOSIS — M6250 Muscle wasting and atrophy, not elsewhere classified, unspecified site: Secondary | ICD-10-CM | POA: Diagnosis not present

## 2012-01-29 DIAGNOSIS — M6250 Muscle wasting and atrophy, not elsewhere classified, unspecified site: Secondary | ICD-10-CM | POA: Diagnosis not present

## 2012-01-29 DIAGNOSIS — R269 Unspecified abnormalities of gait and mobility: Secondary | ICD-10-CM | POA: Diagnosis not present

## 2012-01-30 DIAGNOSIS — R269 Unspecified abnormalities of gait and mobility: Secondary | ICD-10-CM | POA: Diagnosis not present

## 2012-01-30 DIAGNOSIS — M6250 Muscle wasting and atrophy, not elsewhere classified, unspecified site: Secondary | ICD-10-CM | POA: Diagnosis not present

## 2012-02-03 DIAGNOSIS — M109 Gout, unspecified: Secondary | ICD-10-CM | POA: Diagnosis not present

## 2012-02-03 DIAGNOSIS — I10 Essential (primary) hypertension: Secondary | ICD-10-CM | POA: Diagnosis not present

## 2012-02-03 DIAGNOSIS — K219 Gastro-esophageal reflux disease without esophagitis: Secondary | ICD-10-CM | POA: Diagnosis not present

## 2012-02-03 DIAGNOSIS — F0390 Unspecified dementia without behavioral disturbance: Secondary | ICD-10-CM | POA: Diagnosis not present

## 2012-02-03 DIAGNOSIS — F329 Major depressive disorder, single episode, unspecified: Secondary | ICD-10-CM | POA: Diagnosis not present

## 2012-02-04 DIAGNOSIS — R269 Unspecified abnormalities of gait and mobility: Secondary | ICD-10-CM | POA: Diagnosis not present

## 2012-02-04 DIAGNOSIS — M6250 Muscle wasting and atrophy, not elsewhere classified, unspecified site: Secondary | ICD-10-CM | POA: Diagnosis not present

## 2012-02-05 DIAGNOSIS — M6250 Muscle wasting and atrophy, not elsewhere classified, unspecified site: Secondary | ICD-10-CM | POA: Diagnosis not present

## 2012-02-05 DIAGNOSIS — R269 Unspecified abnormalities of gait and mobility: Secondary | ICD-10-CM | POA: Diagnosis not present

## 2012-02-12 DIAGNOSIS — E119 Type 2 diabetes mellitus without complications: Secondary | ICD-10-CM | POA: Diagnosis not present

## 2012-02-12 DIAGNOSIS — J309 Allergic rhinitis, unspecified: Secondary | ICD-10-CM | POA: Diagnosis not present

## 2012-02-12 DIAGNOSIS — I10 Essential (primary) hypertension: Secondary | ICD-10-CM | POA: Diagnosis not present

## 2012-02-12 DIAGNOSIS — M109 Gout, unspecified: Secondary | ICD-10-CM | POA: Diagnosis not present

## 2012-02-12 DIAGNOSIS — M359 Systemic involvement of connective tissue, unspecified: Secondary | ICD-10-CM | POA: Diagnosis not present

## 2012-02-21 DIAGNOSIS — I129 Hypertensive chronic kidney disease with stage 1 through stage 4 chronic kidney disease, or unspecified chronic kidney disease: Secondary | ICD-10-CM | POA: Diagnosis not present

## 2012-02-21 DIAGNOSIS — R5381 Other malaise: Secondary | ICD-10-CM | POA: Diagnosis not present

## 2012-02-21 DIAGNOSIS — M25569 Pain in unspecified knee: Secondary | ICD-10-CM | POA: Diagnosis not present

## 2012-02-21 DIAGNOSIS — E119 Type 2 diabetes mellitus without complications: Secondary | ICD-10-CM | POA: Diagnosis not present

## 2012-02-21 DIAGNOSIS — M109 Gout, unspecified: Secondary | ICD-10-CM | POA: Diagnosis not present

## 2012-02-21 DIAGNOSIS — Z5189 Encounter for other specified aftercare: Secondary | ICD-10-CM | POA: Diagnosis not present

## 2012-02-21 DIAGNOSIS — R5383 Other fatigue: Secondary | ICD-10-CM | POA: Diagnosis not present

## 2012-02-21 DIAGNOSIS — M545 Low back pain: Secondary | ICD-10-CM | POA: Diagnosis not present

## 2012-02-24 DIAGNOSIS — M25569 Pain in unspecified knee: Secondary | ICD-10-CM | POA: Diagnosis not present

## 2012-02-24 DIAGNOSIS — M545 Low back pain: Secondary | ICD-10-CM | POA: Diagnosis not present

## 2012-02-24 DIAGNOSIS — I129 Hypertensive chronic kidney disease with stage 1 through stage 4 chronic kidney disease, or unspecified chronic kidney disease: Secondary | ICD-10-CM | POA: Diagnosis not present

## 2012-02-24 DIAGNOSIS — E119 Type 2 diabetes mellitus without complications: Secondary | ICD-10-CM | POA: Diagnosis not present

## 2012-02-24 DIAGNOSIS — Z5189 Encounter for other specified aftercare: Secondary | ICD-10-CM | POA: Diagnosis not present

## 2012-02-28 DIAGNOSIS — E119 Type 2 diabetes mellitus without complications: Secondary | ICD-10-CM | POA: Diagnosis not present

## 2012-02-28 DIAGNOSIS — M25569 Pain in unspecified knee: Secondary | ICD-10-CM | POA: Diagnosis not present

## 2012-02-28 DIAGNOSIS — M545 Low back pain: Secondary | ICD-10-CM | POA: Diagnosis not present

## 2012-02-28 DIAGNOSIS — I129 Hypertensive chronic kidney disease with stage 1 through stage 4 chronic kidney disease, or unspecified chronic kidney disease: Secondary | ICD-10-CM | POA: Diagnosis not present

## 2012-02-28 DIAGNOSIS — Z5189 Encounter for other specified aftercare: Secondary | ICD-10-CM | POA: Diagnosis not present

## 2012-03-06 DIAGNOSIS — M545 Low back pain: Secondary | ICD-10-CM | POA: Diagnosis not present

## 2012-03-06 DIAGNOSIS — M25569 Pain in unspecified knee: Secondary | ICD-10-CM | POA: Diagnosis not present

## 2012-03-06 DIAGNOSIS — I129 Hypertensive chronic kidney disease with stage 1 through stage 4 chronic kidney disease, or unspecified chronic kidney disease: Secondary | ICD-10-CM | POA: Diagnosis not present

## 2012-03-06 DIAGNOSIS — E119 Type 2 diabetes mellitus without complications: Secondary | ICD-10-CM | POA: Diagnosis not present

## 2012-03-06 DIAGNOSIS — Z5189 Encounter for other specified aftercare: Secondary | ICD-10-CM | POA: Diagnosis not present

## 2012-03-12 DIAGNOSIS — M25569 Pain in unspecified knee: Secondary | ICD-10-CM | POA: Diagnosis not present

## 2012-03-12 DIAGNOSIS — I129 Hypertensive chronic kidney disease with stage 1 through stage 4 chronic kidney disease, or unspecified chronic kidney disease: Secondary | ICD-10-CM | POA: Diagnosis not present

## 2012-03-12 DIAGNOSIS — Z5189 Encounter for other specified aftercare: Secondary | ICD-10-CM | POA: Diagnosis not present

## 2012-03-12 DIAGNOSIS — E119 Type 2 diabetes mellitus without complications: Secondary | ICD-10-CM | POA: Diagnosis not present

## 2012-03-12 DIAGNOSIS — M545 Low back pain: Secondary | ICD-10-CM | POA: Diagnosis not present

## 2012-03-18 DIAGNOSIS — R5381 Other malaise: Secondary | ICD-10-CM | POA: Diagnosis not present

## 2012-03-18 DIAGNOSIS — M109 Gout, unspecified: Secondary | ICD-10-CM | POA: Diagnosis not present

## 2012-03-18 DIAGNOSIS — R5383 Other fatigue: Secondary | ICD-10-CM | POA: Diagnosis not present

## 2012-03-18 DIAGNOSIS — M2550 Pain in unspecified joint: Secondary | ICD-10-CM | POA: Diagnosis not present

## 2012-03-18 DIAGNOSIS — M359 Systemic involvement of connective tissue, unspecified: Secondary | ICD-10-CM | POA: Diagnosis not present

## 2012-03-18 DIAGNOSIS — M545 Low back pain: Secondary | ICD-10-CM | POA: Diagnosis not present

## 2012-05-13 DIAGNOSIS — M109 Gout, unspecified: Secondary | ICD-10-CM | POA: Diagnosis not present

## 2012-05-13 DIAGNOSIS — E119 Type 2 diabetes mellitus without complications: Secondary | ICD-10-CM | POA: Diagnosis not present

## 2012-05-13 DIAGNOSIS — I129 Hypertensive chronic kidney disease with stage 1 through stage 4 chronic kidney disease, or unspecified chronic kidney disease: Secondary | ICD-10-CM | POA: Diagnosis not present

## 2012-12-01 DIAGNOSIS — H903 Sensorineural hearing loss, bilateral: Secondary | ICD-10-CM | POA: Diagnosis not present

## 2012-12-21 DIAGNOSIS — I10 Essential (primary) hypertension: Secondary | ICD-10-CM | POA: Diagnosis not present

## 2012-12-21 DIAGNOSIS — M109 Gout, unspecified: Secondary | ICD-10-CM | POA: Diagnosis not present

## 2012-12-21 DIAGNOSIS — M359 Systemic involvement of connective tissue, unspecified: Secondary | ICD-10-CM | POA: Diagnosis not present

## 2012-12-21 DIAGNOSIS — E119 Type 2 diabetes mellitus without complications: Secondary | ICD-10-CM | POA: Diagnosis not present

## 2012-12-21 DIAGNOSIS — K219 Gastro-esophageal reflux disease without esophagitis: Secondary | ICD-10-CM | POA: Diagnosis not present

## 2013-08-05 DIAGNOSIS — N183 Chronic kidney disease, stage 3 unspecified: Secondary | ICD-10-CM | POA: Diagnosis not present

## 2013-08-05 DIAGNOSIS — E119 Type 2 diabetes mellitus without complications: Secondary | ICD-10-CM | POA: Diagnosis not present

## 2013-08-05 DIAGNOSIS — Z23 Encounter for immunization: Secondary | ICD-10-CM | POA: Diagnosis not present

## 2013-08-16 DIAGNOSIS — E785 Hyperlipidemia, unspecified: Secondary | ICD-10-CM | POA: Diagnosis not present

## 2013-08-16 DIAGNOSIS — E119 Type 2 diabetes mellitus without complications: Secondary | ICD-10-CM | POA: Diagnosis not present

## 2013-08-16 DIAGNOSIS — N183 Chronic kidney disease, stage 3 unspecified: Secondary | ICD-10-CM | POA: Diagnosis not present

## 2013-08-16 DIAGNOSIS — M109 Gout, unspecified: Secondary | ICD-10-CM | POA: Diagnosis not present

## 2013-08-16 DIAGNOSIS — I10 Essential (primary) hypertension: Secondary | ICD-10-CM | POA: Diagnosis not present

## 2013-08-26 DIAGNOSIS — F05 Delirium due to known physiological condition: Secondary | ICD-10-CM | POA: Diagnosis not present

## 2013-08-26 DIAGNOSIS — I10 Essential (primary) hypertension: Secondary | ICD-10-CM | POA: Diagnosis not present

## 2013-12-06 DIAGNOSIS — R0609 Other forms of dyspnea: Secondary | ICD-10-CM | POA: Diagnosis not present

## 2013-12-09 ENCOUNTER — Ambulatory Visit
Admission: RE | Admit: 2013-12-09 | Discharge: 2013-12-09 | Disposition: A | Discharge status: Home / Self Care | Payer: Medicare Other | Source: Ambulatory Visit | Attending: Family Medicine | Admitting: Family Medicine

## 2013-12-09 ENCOUNTER — Other Ambulatory Visit: Payer: Self-pay | Admitting: Family Medicine

## 2013-12-09 DIAGNOSIS — R0989 Other specified symptoms and signs involving the circulatory and respiratory systems: Secondary | ICD-10-CM | POA: Diagnosis not present

## 2013-12-09 DIAGNOSIS — M545 Low back pain, unspecified: Secondary | ICD-10-CM | POA: Diagnosis not present

## 2013-12-09 DIAGNOSIS — R0600 Dyspnea, unspecified: Secondary | ICD-10-CM

## 2013-12-09 DIAGNOSIS — R918 Other nonspecific abnormal finding of lung field: Secondary | ICD-10-CM | POA: Diagnosis not present

## 2013-12-09 DIAGNOSIS — R0609 Other forms of dyspnea: Secondary | ICD-10-CM | POA: Diagnosis not present

## 2013-12-29 ENCOUNTER — Ambulatory Visit (INDEPENDENT_AMBULATORY_CARE_PROVIDER_SITE_OTHER)
Admission: RE | Admit: 2013-12-29 | Discharge: 2013-12-29 | Disposition: A | Discharge status: Home / Self Care | Payer: Medicare Other | Source: Ambulatory Visit | Attending: Internal Medicine | Admitting: Internal Medicine

## 2013-12-29 ENCOUNTER — Other Ambulatory Visit (INDEPENDENT_AMBULATORY_CARE_PROVIDER_SITE_OTHER): Payer: Medicare Other

## 2013-12-29 ENCOUNTER — Ambulatory Visit (INDEPENDENT_AMBULATORY_CARE_PROVIDER_SITE_OTHER): Payer: Medicare Other | Admitting: Internal Medicine

## 2013-12-29 ENCOUNTER — Encounter: Payer: Self-pay | Admitting: Internal Medicine

## 2013-12-29 VITALS — BP 124/84 | HR 83 | Temp 98.2°F | Wt 166.8 lb

## 2013-12-29 DIAGNOSIS — J8410 Pulmonary fibrosis, unspecified: Secondary | ICD-10-CM | POA: Diagnosis not present

## 2013-12-29 DIAGNOSIS — R0600 Dyspnea, unspecified: Secondary | ICD-10-CM | POA: Insufficient documentation

## 2013-12-29 DIAGNOSIS — R0609 Other forms of dyspnea: Secondary | ICD-10-CM

## 2013-12-29 DIAGNOSIS — J189 Pneumonia, unspecified organism: Secondary | ICD-10-CM | POA: Diagnosis not present

## 2013-12-29 DIAGNOSIS — R0989 Other specified symptoms and signs involving the circulatory and respiratory systems: Secondary | ICD-10-CM

## 2013-12-29 LAB — CBC WITH DIFFERENTIAL/PLATELET
BASOS ABS: 0 10*3/uL (ref 0.0–0.1)
BASOS PCT: 0.4 % (ref 0.0–3.0)
Eosinophils Absolute: 0.5 10*3/uL (ref 0.0–0.7)
Eosinophils Relative: 6.2 % — ABNORMAL HIGH (ref 0.0–5.0)
HCT: 42 % (ref 39.0–52.0)
HEMOGLOBIN: 13.7 g/dL (ref 13.0–17.0)
LYMPHS PCT: 20.1 % (ref 12.0–46.0)
Lymphs Abs: 1.6 10*3/uL (ref 0.7–4.0)
MCHC: 32.7 g/dL (ref 30.0–36.0)
MCV: 96.3 fl (ref 78.0–100.0)
MONOS PCT: 11.6 % (ref 3.0–12.0)
Monocytes Absolute: 0.9 10*3/uL (ref 0.1–1.0)
NEUTROS ABS: 4.8 10*3/uL (ref 1.4–7.7)
Neutrophils Relative %: 61.7 % (ref 43.0–77.0)
Platelets: 222 10*3/uL (ref 150.0–400.0)
RBC: 4.36 Mil/uL (ref 4.22–5.81)
RDW: 14.3 % (ref 11.5–15.5)
WBC: 7.8 10*3/uL (ref 4.0–10.5)

## 2013-12-29 LAB — SEDIMENTATION RATE: Sed Rate: 41 mm/hr — ABNORMAL HIGH (ref 0–22)

## 2013-12-29 NOTE — Assessment & Plan Note (Addendum)
DDx for pulmonary fibrosis  includes idiopathic pulmonary fibrosis, pulmonary fibrosis associated with rheumatologic diseases (which have a relatively benign course in most cases) , adverse effect from  drugs such as chemotherapy or amiodarone exposure, nonspecific interstitial pneumonia which is typically steroid responsive, and chronic hypersensitivity pneumonitis.   In active  smokers Langerhan's Cell  Histiocyctosis (eosinophilic granuomatosis),  DIP,  and Respiratory Bronchiolitis ILD also need to be considered but are very unlikely here given his smoking hx  Most likely he had an acute worsening of symptoms / RML pna resolving p rx with levaquin but needs to return for  pfts and repeat walking sats in 4 weeks to establish firm baseline and determine whether further w/u / rx needed

## 2013-12-29 NOTE — Assessment & Plan Note (Signed)
-   12/29/2013   Walked RA x one lap @ 185 stopped due to  84% moderate pace  Very difficult to convince pt he's any different from baseline, not interested in 02 > f/u in 4 weeks with pfts/ repeat walking sats

## 2013-12-29 NOTE — Patient Instructions (Addendum)
Please remember to go to the lab and x-ray department downstairs for your tests - we will call you with the results when they are available.  Pace yourself to about half the speed you normally go until we sort your problems out      Please schedule a follow up office visit in 4 weeks, sooner if needed with all meds from all doctors  in hand

## 2013-12-29 NOTE — Progress Notes (Signed)
Subjective:    Patient ID: Jack Solis, male    DOB: 1934/06/13  MRN: [id number]  HPI  [age] yowm never smoker ? Labored breathing developed one week prior to OV  With Dr Cliffton AstersWhite with cxr 12/09/13 ? pna > [facility]quin [facility] and referred to pulmonary clinic 12/29/2013 by Dr Cliffton AstersWhite  Rheum = Jack Jordanngela Solis    12/29/2013 1st [facility] office visit/ Jack Solis  Chief Complaint  Patient presents with  . PULMONARY CONSULT    Referred by Dr Tiburcio PeaHarris. Per friend, pt was dx with walking PNA in the last week-- [facility]?  hoarseness x sev years but otherwise no sign pulmonary problems and not clear he had any acute awareness of any problem prior to his cxr 12/09/13 which suggested low lung vol, ? ILD ? MAI ? rml infiltrates.   No previous exp to chemo or amiodarone or any form of ALI on the EMR.  Does have ? RA but not active. Walked a block his baseline day of ov s change pace or sob > baseline x years   No obvious day to day or daytime variabilty or assoc chronic cough or cp or chest tightness, subjective wheeze overt sinus or hb symptoms. No unusual exp hx or h/o childhood pna/ asthma or knowledge of premature birth.  Sleeping ok without nocturnal  or early am exacerbation  of respiratory  c/o's or need for noct saba. Also denies any obvious fluctuation of symptoms with weather or environmental changes or other aggravating or alleviating factors except as outlined above   Current Medications, Allergies, Complete Past Medical History, Past Surgical History, Family History, and Social History were reviewed in [facility] electronic medical record.            Review of Systems  Constitutional: Negative for fever and unexpected weight change.  HENT: Negative for congestion, dental problem, ear pain, nosebleeds, postnasal drip, rhinorrhea, sinus pressure, sneezing, sore throat and trouble swallowing.   Eyes: Negative for redness and itching.  Respiratory: Positive for  shortness of breath. Negative for cough, chest tightness and wheezing.   Cardiovascular: Negative for palpitations and leg swelling.  Gastrointestinal: Negative for nausea and vomiting.  Genitourinary: Negative for dysuria.  Musculoskeletal: Negative for joint swelling.  Skin: Negative for rash.  Neurological: Negative for headaches.  Hematological: Does not bruise/bleed easily.  Psychiatric/Behavioral: Positive for dysphoric mood ( some at times). The patient is not nervous/anxious.        Objective:   Physical Exam  Wt Readings from Last 3 Encounters:  12/29/13 166 lb 12.8 oz (75.66 kg)  02/06/09 136 lb (61.689 kg)      HEENT: nl dentition, turbinates, and orophanx. Nl external ear canals without cough reflex   NECK :  without JVD/Nodes/TM/ nl carotid upstrokes bilaterally   LUNGS: no acc muscle use, clear to A and P bilaterally without cough on insp or exp maneuvers   CV:  RRR  no s3 or murmur or increase in P2, no edema   ABD:  soft and nontender with nl excursion in the supine position. No bruits or organomegaly, bowel sounds nl  MS:  warm without deformities, calf tenderness, cyanosis or clubbing  SKIN: warm and dry without lesions    NEURO:  alert, approp, no deficits    CXR  12/29/2013 :  Decreased conspicuity of patchy right middle lobe opacity consistent with resolving pneumonia.       Lab Results  Component Value Date   ESRSEDRATE 41*  12/29/2013   ESRSEDRATE 100* 03/19/2009   ESRSEDRATE  Value: 79 CORRECTED ON 09/20 AT 1548: PREVIOUSLY REPORTED AS 72* 03/13/2009        Recent Labs Lab 12/29/13 1656  HGB 13.7  HCT 42.0  WBC 7.8  PLT 222.0         Assessment & Plan:

## 2013-12-30 NOTE — Progress Notes (Signed)
Quick Note:  Spoke with pt and notified of results per Dr. Wert. Pt verbalized understanding and denied any questions.  ______ 

## 2013-12-31 ENCOUNTER — Telehealth: Payer: Self-pay | Admitting: Internal Medicine

## 2013-12-31 NOTE — Telephone Encounter (Signed)
Called spoke with Jack Solis. Made her aware of MW recs from OV yesterday. Also made her aware of cxr and lab results. Nothing further needed

## 2014-02-14 ENCOUNTER — Ambulatory Visit
Admission: RE | Admit: 2014-02-14 | Discharge: 2014-02-14 | Disposition: A | Discharge status: Home / Self Care | Payer: Medicare Other | Source: Ambulatory Visit | Attending: Family Medicine | Admitting: Family Medicine

## 2014-02-14 ENCOUNTER — Other Ambulatory Visit: Payer: Self-pay | Admitting: Family Medicine

## 2014-02-14 DIAGNOSIS — J189 Pneumonia, unspecified organism: Secondary | ICD-10-CM

## 2014-02-14 DIAGNOSIS — M109 Gout, unspecified: Secondary | ICD-10-CM | POA: Diagnosis not present

## 2014-02-14 DIAGNOSIS — K219 Gastro-esophageal reflux disease without esophagitis: Secondary | ICD-10-CM | POA: Diagnosis not present

## 2014-02-14 DIAGNOSIS — J40 Bronchitis, not specified as acute or chronic: Secondary | ICD-10-CM | POA: Diagnosis not present

## 2014-02-14 DIAGNOSIS — I10 Essential (primary) hypertension: Secondary | ICD-10-CM | POA: Diagnosis not present

## 2014-02-14 DIAGNOSIS — N183 Chronic kidney disease, stage 3 unspecified: Secondary | ICD-10-CM | POA: Diagnosis not present

## 2014-02-14 DIAGNOSIS — E119 Type 2 diabetes mellitus without complications: Secondary | ICD-10-CM | POA: Diagnosis not present

## 2014-07-07 DIAGNOSIS — F0390 Unspecified dementia without behavioral disturbance: Secondary | ICD-10-CM | POA: Diagnosis not present

## 2014-07-07 DIAGNOSIS — E119 Type 2 diabetes mellitus without complications: Secondary | ICD-10-CM | POA: Diagnosis not present

## 2014-07-07 DIAGNOSIS — R350 Frequency of micturition: Secondary | ICD-10-CM | POA: Diagnosis not present

## 2014-07-07 DIAGNOSIS — Z23 Encounter for immunization: Secondary | ICD-10-CM | POA: Diagnosis not present

## 2014-07-07 DIAGNOSIS — R410 Disorientation, unspecified: Secondary | ICD-10-CM | POA: Diagnosis not present

## 2014-08-02 DIAGNOSIS — M1990 Unspecified osteoarthritis, unspecified site: Secondary | ICD-10-CM | POA: Diagnosis not present

## 2014-08-02 DIAGNOSIS — F0390 Unspecified dementia without behavioral disturbance: Secondary | ICD-10-CM | POA: Diagnosis not present

## 2014-08-02 DIAGNOSIS — E780 Pure hypercholesterolemia: Secondary | ICD-10-CM | POA: Diagnosis not present

## 2014-08-02 DIAGNOSIS — K219 Gastro-esophageal reflux disease without esophagitis: Secondary | ICD-10-CM | POA: Diagnosis not present

## 2014-08-02 DIAGNOSIS — I129 Hypertensive chronic kidney disease with stage 1 through stage 4 chronic kidney disease, or unspecified chronic kidney disease: Secondary | ICD-10-CM | POA: Diagnosis not present

## 2014-08-02 DIAGNOSIS — M359 Systemic involvement of connective tissue, unspecified: Secondary | ICD-10-CM | POA: Diagnosis not present

## 2014-08-02 DIAGNOSIS — M109 Gout, unspecified: Secondary | ICD-10-CM | POA: Diagnosis not present

## 2014-08-02 DIAGNOSIS — E119 Type 2 diabetes mellitus without complications: Secondary | ICD-10-CM | POA: Diagnosis not present

## 2015-03-01 DIAGNOSIS — E119 Type 2 diabetes mellitus without complications: Secondary | ICD-10-CM | POA: Diagnosis not present

## 2015-03-01 DIAGNOSIS — Z8711 Personal history of peptic ulcer disease: Secondary | ICD-10-CM | POA: Diagnosis not present

## 2015-03-01 DIAGNOSIS — M545 Low back pain: Secondary | ICD-10-CM | POA: Diagnosis not present

## 2015-03-01 DIAGNOSIS — N183 Chronic kidney disease, stage 3 (moderate): Secondary | ICD-10-CM | POA: Diagnosis not present

## 2015-06-29 DIAGNOSIS — J069 Acute upper respiratory infection, unspecified: Secondary | ICD-10-CM | POA: Diagnosis not present

## 2015-07-20 DIAGNOSIS — Z23 Encounter for immunization: Secondary | ICD-10-CM | POA: Diagnosis not present

## 2015-11-07 DIAGNOSIS — I10 Essential (primary) hypertension: Secondary | ICD-10-CM | POA: Diagnosis not present

## 2015-11-07 DIAGNOSIS — E7800 Pure hypercholesterolemia, unspecified: Secondary | ICD-10-CM | POA: Diagnosis not present

## 2015-11-07 DIAGNOSIS — K219 Gastro-esophageal reflux disease without esophagitis: Secondary | ICD-10-CM | POA: Diagnosis not present

## 2015-11-07 DIAGNOSIS — N183 Chronic kidney disease, stage 3 (moderate): Secondary | ICD-10-CM | POA: Diagnosis not present

## 2015-11-07 DIAGNOSIS — M109 Gout, unspecified: Secondary | ICD-10-CM | POA: Diagnosis not present

## 2015-11-07 DIAGNOSIS — E119 Type 2 diabetes mellitus without complications: Secondary | ICD-10-CM | POA: Diagnosis not present

## 2015-11-07 DIAGNOSIS — F0390 Unspecified dementia without behavioral disturbance: Secondary | ICD-10-CM | POA: Diagnosis not present

## 2016-05-14 DIAGNOSIS — E119 Type 2 diabetes mellitus without complications: Secondary | ICD-10-CM | POA: Diagnosis not present

## 2016-05-14 DIAGNOSIS — I10 Essential (primary) hypertension: Secondary | ICD-10-CM | POA: Diagnosis not present

## 2016-05-14 DIAGNOSIS — K219 Gastro-esophageal reflux disease without esophagitis: Secondary | ICD-10-CM | POA: Diagnosis not present

## 2016-05-14 DIAGNOSIS — N183 Chronic kidney disease, stage 3 (moderate): Secondary | ICD-10-CM | POA: Diagnosis not present

## 2016-05-14 DIAGNOSIS — E7800 Pure hypercholesterolemia, unspecified: Secondary | ICD-10-CM | POA: Diagnosis not present

## 2016-05-14 DIAGNOSIS — M109 Gout, unspecified: Secondary | ICD-10-CM | POA: Diagnosis not present

## 2016-05-14 DIAGNOSIS — F0390 Unspecified dementia without behavioral disturbance: Secondary | ICD-10-CM | POA: Diagnosis not present

## 2016-05-14 DIAGNOSIS — R0602 Shortness of breath: Secondary | ICD-10-CM | POA: Diagnosis not present

## 2016-06-04 ENCOUNTER — Ambulatory Visit
Admission: RE | Admit: 2016-06-04 | Discharge: 2016-06-04 | Disposition: A | Discharge status: Home / Self Care | Payer: Medicare Other | Source: Ambulatory Visit | Attending: Family Medicine | Admitting: Family Medicine

## 2016-06-04 ENCOUNTER — Other Ambulatory Visit: Payer: Self-pay | Admitting: Family Medicine

## 2016-06-04 DIAGNOSIS — R0602 Shortness of breath: Secondary | ICD-10-CM

## 2016-06-28 ENCOUNTER — Ambulatory Visit (INDEPENDENT_AMBULATORY_CARE_PROVIDER_SITE_OTHER)
Admission: RE | Admit: 2016-06-28 | Discharge: 2016-06-28 | Disposition: A | Discharge status: Home / Self Care | Payer: Medicare Other | Source: Ambulatory Visit | Attending: Internal Medicine | Admitting: Internal Medicine

## 2016-06-28 ENCOUNTER — Encounter: Payer: Self-pay | Admitting: Internal Medicine

## 2016-06-28 ENCOUNTER — Ambulatory Visit (INDEPENDENT_AMBULATORY_CARE_PROVIDER_SITE_OTHER): Payer: Medicare Other | Admitting: Internal Medicine

## 2016-06-28 ENCOUNTER — Other Ambulatory Visit (INDEPENDENT_AMBULATORY_CARE_PROVIDER_SITE_OTHER): Payer: Medicare Other

## 2016-06-28 VITALS — BP 120/82 | HR 99 | Ht 65.5 in | Wt 159.9 lb

## 2016-06-28 DIAGNOSIS — J8410 Pulmonary fibrosis, unspecified: Secondary | ICD-10-CM

## 2016-06-28 DIAGNOSIS — R0602 Shortness of breath: Secondary | ICD-10-CM | POA: Diagnosis not present

## 2016-06-28 LAB — CBC WITH DIFFERENTIAL/PLATELET
BASOS PCT: 0.4 % (ref 0.0–3.0)
Basophils Absolute: 0 10*3/uL (ref 0.0–0.1)
EOS ABS: 0.5 10*3/uL (ref 0.0–0.7)
Eosinophils Relative: 5.1 % — ABNORMAL HIGH (ref 0.0–5.0)
HEMATOCRIT: 44.1 % (ref 39.0–52.0)
Hemoglobin: 15.1 g/dL (ref 13.0–17.0)
LYMPHS ABS: 2 10*3/uL (ref 0.7–4.0)
Lymphocytes Relative: 20.6 % (ref 12.0–46.0)
MCHC: 34.2 g/dL (ref 30.0–36.0)
MCV: 95.6 fl (ref 78.0–100.0)
MONOS PCT: 8.4 % (ref 3.0–12.0)
Monocytes Absolute: 0.8 10*3/uL (ref 0.1–1.0)
NEUTROS ABS: 6.4 10*3/uL (ref 1.4–7.7)
NEUTROS PCT: 65.5 % (ref 43.0–77.0)
PLATELETS: 239 10*3/uL (ref 150.0–400.0)
RBC: 4.61 Mil/uL (ref 4.22–5.81)
RDW: 14.6 % (ref 11.5–15.5)
WBC: 9.8 10*3/uL (ref 4.0–10.5)

## 2016-06-28 LAB — SEDIMENTATION RATE: Sed Rate: 29 mm/hr — ABNORMAL HIGH (ref 0–20)

## 2016-06-28 NOTE — Progress Notes (Signed)
Subjective:    Patient ID: Jack Solis, male    DOB: 1933/08/19  MRN: [id number]    Brief patient profile:  [age] yowm never smoker ? Labored breathing developed one week prior to OV  With Jack Solis with cxr 12/09/13 ? pna [facility] and referred to pulmonary clinic 12/29/2013 by Jack Solis with dx of probably PF    Rheum = Jack Solis   12/29/2013 1st Jack Solis  Chief Complaint  Patient presents with  . PULMONARY CONSULT    Referred by Jack Jack Solis. Per friend, pt was dx with walking PNA in the last week-- [facility]?  hoarseness x sev years but otherwise no sign pulmonary problems and not clear he had any acute awareness of any problem prior to his cxr 12/09/13 which suggested low lung vol, ? ILD ? MAI ? rml infiltrates.   No previous exp to chemo or amiodarone or any form of ALI on the EMR.  Does have ? RA but not active. Walked a block = his baseline day of ov s change pace or sob > baseline x years  rec Pace yourself to about half the speed you normally go until we sort your problems out  Please schedule a follow up office visit in 4 weeks, sooner if needed with all meds from all doctors  in hand> did not do     06/28/2016  f/u ov/[name]/ ? Worse PF  / on plaquenil Chief Complaint  Patient presents with  . Follow-up    Per pt. has not had any chest pain or SOB.      Not limited by breathing from desired activities  But cannot give a coherent hx and doesn't know why he's here  As far as I can tell from pt/ records from NH no apparent or obvious day to day or daytime variability or assoc excess/ purulent sputum or mucus plugs or hemoptysis or cp or chest tightness, subjective wheeze or overt sinus or hb symptoms. No unusual exp hx or h/o childhood pna/ asthma or knowledge of premature birth.  Sleeping ok without nocturnal  or early am exacerbation  of respiratory  c/o's or need for noct saba. Also denies any  obvious fluctuation of symptoms with weather or environmental changes or other aggravating or alleviating factors except as outlined above   Current Medications, Allergies, Complete Past Medical History, Past Surgical History, Family History, and Social History were reviewed in [facility] electronic medical record.  ROS  The following are not active complaints unless bolded sore throat, dysphagia, dental problems, itching, sneezing,  nasal congestion or excess/ purulent secretions, ear ache,   fever, chills, sweats, unintended wt loss, classically pleuritic or exertional cp,  orthopnea pnd or leg swelling, presyncope, palpitations, abdominal pain, anorexia, nausea, vomiting, diarrhea  or change in bowel or bladder habits, change in stools or urine, dysuria,hematuria,  rash, arthralgias, visual complaints, headache, numbness, weakness or ataxia or problems with walking or coordination,  change in mood/affect or memory.                Objective:   Physical Exam  06/28/2016          159   12/29/13 166 lb 12.8 oz (75.66 kg)  02/06/09 136 lb (61.689 kg)      Vital signs reviewed.   - Note on arrival 02 sats  91% on RA    HEENT: nl dentition, turbinates, and oropharynx. Nl external  ear canals without cough reflex   NECK :  without JVD/Nodes/TM/ nl carotid upstrokes bilaterally   LUNGS: no acc muscle use, distant insp crackles bilaterally with no cough on insp   CV:  RRR  no s3 or murmur or increase in P2, no edema   ABD:  soft and nontender with nl excursion in the supine position. No bruits or organomegaly, bowel sounds nl  MS:  warm without deformities, calf tenderness, cyanosis or clubbing  SKIN: warm and dry without lesions    NEURO:  alert, approp, no deficits     CXR PA and Lateral:   06/28/2016 :    I personally reviewed images and agree with radiology impression as follows:    Chronic lung changes without evidence of superimposed acute cardiopulmonary disease.   Lab  Results  Component Value Date   WBC 9.8 06/28/2016   HGB 15.1 06/28/2016   HCT 44.1 06/28/2016   MCV 95.6 06/28/2016   PLT 239.0 06/28/2016       EOS                       0.5       06/28/2016    Lab Results  Component Value Date   ESRSEDRATE 29 (H) 06/28/2016   ESRSEDRATE 41 (H) 12/29/2013   ESRSEDRATE 100 (H) 03/19/2009                     Assessment & Plan:

## 2016-06-28 NOTE — Progress Notes (Signed)
ATC, NA and no VM  

## 2016-06-28 NOTE — Patient Instructions (Addendum)
Please remember to go to the lab and x-ray department downstairs for your tests - we will call you with the results when they are available.      

## 2016-06-30 NOTE — Assessment & Plan Note (Signed)
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

## 2016-07-01 NOTE — Progress Notes (Signed)
ATC NA and no VM set up yet

## 2016-07-01 NOTE — Progress Notes (Signed)
ATC NA and no VM set up yet

## 2016-07-05 DIAGNOSIS — K219 Gastro-esophageal reflux disease without esophagitis: Secondary | ICD-10-CM | POA: Diagnosis not present

## 2016-07-05 DIAGNOSIS — R0602 Shortness of breath: Secondary | ICD-10-CM | POA: Diagnosis not present

## 2016-07-05 DIAGNOSIS — Z23 Encounter for immunization: Secondary | ICD-10-CM | POA: Diagnosis not present

## 2016-07-23 ENCOUNTER — Telehealth: Payer: Self-pay | Admitting: Internal Medicine

## 2016-07-23 NOTE — Telephone Encounter (Signed)
MW  This pt. Daughter called in and she [place] and is unable to come in to discuss the treatment options for her father as far as his need for oxygen and she has lots of questions. She is wanting to know if you could call her and you two can discuss this further.    [phone] Bison- Tara

## 2016-07-23 NOTE — Telephone Encounter (Signed)
First call pt and see if he already has 02 as we did not document it in the note then I'll call daughter with recs

## 2016-07-23 NOTE — Telephone Encounter (Signed)
lmomtcb x1 

## 2016-07-24 NOTE — Telephone Encounter (Signed)
lmtcb x2 for pt's daughter, Delice Bisonara.

## 2016-07-25 ENCOUNTER — Encounter: Payer: Self-pay | Admitting: Internal Medicine

## 2016-07-25 DIAGNOSIS — J9611 Chronic respiratory failure with hypoxia: Secondary | ICD-10-CM | POA: Insufficient documentation

## 2016-07-25 NOTE — Telephone Encounter (Signed)
Got recorder, requested she give we windows when she'll be home

## 2016-07-25 NOTE — Telephone Encounter (Signed)
Order 02  2lpm and ambulatory 02 titration to see if eligible for POC  Dx is chronic resp failure due to hypoxemia and pulmonary fibrosis

## 2016-07-25 NOTE — Telephone Encounter (Signed)
Dr. Sherene SiresWert please advise if O2 is only with exertion or cont.  I have spoke with pt's daughter, Delice Bisonara, and made her aware of MW's recommendations. Delice Bisonara request that request that the DME company contact her to get pt scheduled at [phone]. Will place order have MW response.  MW please advise. Thanks.

## 2016-07-25 NOTE — Telephone Encounter (Signed)
Delice Bisonara returned Dr. Thurston HoleWert's call.  States will be home from 3:00 pm on today, CB [phone].

## 2016-07-25 NOTE — Telephone Encounter (Signed)
Spoke with pt's daughter, Delice Bisonara. States that pt is NOT currently using oxygen. Delice Bisonara has several questions for MW about the pt's health. She would like a call from MW if possible.  MW - please advise. Thanks.

## 2016-07-26 NOTE — Telephone Encounter (Signed)
Spoke with pt's daughter, Delice Bisonara. Pt was not walked on oxygen at his last OV. This will have to be done before we can sent the order to a DME for oxygen. Delice Bisonara states that she will talk with the pt's caregiver and get back to us as to when we can schedule this walk. Await Tara's call back.

## 2016-07-26 NOTE — Telephone Encounter (Signed)
Exertion only  - no need at rest or hs

## 2016-07-30 NOTE — Telephone Encounter (Signed)
Spoke with pt's daughter, Delice Bisonara. Pt has been scheduled for his qualifying walk on 08/02/16 at 11:30am. Nothing further was needed.

## 2016-08-02 ENCOUNTER — Ambulatory Visit (INDEPENDENT_AMBULATORY_CARE_PROVIDER_SITE_OTHER): Payer: Medicare Other | Admitting: Internal Medicine

## 2016-08-02 DIAGNOSIS — J8410 Pulmonary fibrosis, unspecified: Secondary | ICD-10-CM

## 2016-08-03 NOTE — Progress Notes (Signed)
Here for 6 m walk  

## 2016-08-03 NOTE — Assessment & Plan Note (Signed)
-  ESR  12/29/2013  = 41 - 12/29/2013   Walked RA x one lap @ 185 stopped due to  84% moderate pace - 06/28/2016   Walked RA x one lap @ 185 stopped due to  Sob/fatigue with sats dropping to 78% moderate pace - ESR 06/28/2016  29/ Eos 0.5  - 08/02/2016   Walked @ 3lpm 2 laps @ 185 ft each stopped due to  Sob but sats 91% at end nl pace

## 2016-08-19 ENCOUNTER — Telehealth: Payer: Self-pay | Admitting: Internal Medicine

## 2016-08-19 DIAGNOSIS — J8410 Pulmonary fibrosis, unspecified: Secondary | ICD-10-CM

## 2016-08-19 NOTE — Telephone Encounter (Signed)
Spoke with pt's daughter, Delice Bisonara. Pt had a qualifying walk on 08/02/16 but the order was not placed for oxygen. I apologized to Delice Bisonara and advised her that I would place this order as urgent and have this taken care of as quickly as possible. She was very Adult nurseappreciative. Nothing further was needed.

## 2016-08-19 NOTE — Telephone Encounter (Signed)
Spoke with Larita FifeLynn at [facility]. She states that pt will need an OV as his last OV is greater than 30 days ago. At this OV, pt will need to walked again and order will need to placed for the oxygen. I called and spoke with pt's daughter, Delice Bisonara. She is aware of this information and was not happy. OV has been scheduled with TP on 08/22/16 at 12pm. I will speak to Shanda BumpsJessica about this pt to make sure that everything gets taken care of this time around.

## 2016-08-22 ENCOUNTER — Ambulatory Visit (INDEPENDENT_AMBULATORY_CARE_PROVIDER_SITE_OTHER): Payer: Medicare Other | Admitting: Adult Health

## 2016-08-22 ENCOUNTER — Encounter: Payer: Self-pay | Admitting: Adult Health

## 2016-08-22 VITALS — BP 126/74 | HR 79 | Ht 65.5 in | Wt 156.8 lb

## 2016-08-22 DIAGNOSIS — J8410 Pulmonary fibrosis, unspecified: Secondary | ICD-10-CM

## 2016-08-22 DIAGNOSIS — J9611 Chronic respiratory failure with hypoxia: Secondary | ICD-10-CM | POA: Diagnosis not present

## 2016-08-22 NOTE — Progress Notes (Signed)
 @Patient  ID: Jack Solis, male    DOB: Jul 14, 1933, [age] y.o.   MRN: [id number]  Chief Complaint  Patient presents with  . Follow-up    IPF     Referring provider: Johny BlamerHarris, William, MD  HPI: [age] yowm never smoker seen for initial pulmonary consult in 07/2013 for pneumonia/dyspnea. Felt to have probable Pulmonary Fibrosis changes on Chest xray .    Rheum = Zenovia Jordanngela Hawkes  08/22/2016 Follow up :? IPF/O2 RF  Pt returns for follow up for Oxygen qualification. . . He was started on O2 last month but he did not receive it from the home care company. He is here today to be requalifed for O2 and new order for O2 . We discussed beginning O2 at 2l/m with activity .  Pt is felt to have probable PF , with changes on chest xray . He does get winded with walking but denies much cough . See below :   SATURATION QUALIFICATIONS: (This note is used to comply with regulatory documentation for home oxygen) Patient Saturations on Room Air at Rest = 91% Patient Saturations on Room Air while Ambulating = 85% Patient Saturations on 2 Liters of oxygen while Ambulating = 95%   He is followed by Rheumatology and is on Plaquenil  Unclear what he is on this for. Will try to get records.    Allergies  Allergen Reactions  . Penicillins     Immunization History  Administered Date(s) Administered  . Influenza Split 03/24/2016  . Influenza,inj,Quad PF,36+ Mos 03/24/2013    Past Medical History:  Diagnosis Date  . CKD (chronic kidney disease)   . Gout   . Hypercholesterolemia   . Hypertension   . Kidney stone   . OA (osteoarthritis)   . PUD (peptic ulcer disease)   . Type 2 diabetes mellitus (HCC)     Tobacco History: History  Smoking Status  . Never Smoker  Smokeless Tobacco  . Never Used   Counseling given: Not Answered   Outpatient Encounter Prescriptions as of 08/22/2016  Medication Sig  . allopurinol (ZYLOPRIM) 300 MG tablet Take 300 mg by mouth daily.  . hydroxychloroquine  (PLAQUENIL) 200 MG tablet Take 200 mg by mouth daily.  . metoprolol tartrate (LOPRESSOR) 25 MG tablet Take 25 mg by mouth 2 (two) times daily.  . probenecid (BENEMID) 500 MG tablet Take 500 mg by mouth 2 (two) times daily.   No facility-administered encounter medications on file as of 08/22/2016.      Review of Systems  Constitutional:   No  weight loss, night sweats,  Fevers, chills, + fatigue, or  lassitude.  HEENT:   No headaches,  Difficulty swallowing,  Tooth/dental problems, or  Sore throat,                No sneezing, itching, ear ache, nasal congestion, post nasal drip,   CV:  No chest pain,  Orthopnea, PND, swelling in lower extremities, anasarca, dizziness, palpitations, syncope.   GI  No heartburn, indigestion, abdominal pain, nausea, vomiting, diarrhea, change in bowel habits, loss of appetite, bloody stools.   Resp: .  No chest wall deformity  Skin: no rash or lesions.  GU: no dysuria, change in color of urine, no urgency or frequency.  No flank pain, no hematuria   MS:  No joint pain or swelling.  No decreased range of motion.  No back pain.    Physical Exam  BP 126/74 (BP Location: Left Arm, Cuff Size: Normal)   Pulse  79   Ht 5' 5.5" (1.664 m)   Wt 156 lb 12.8 oz (71.1 kg)   SpO2 91%   BMI 25.70 kg/m   GEN: A/Ox3; pleasant , NAD, elderly    HEENT:  Bowers/AT,  EACs-clear, TMs-wnl, NOSE-clear, THROAT-clear, no lesions, no postnasal drip or exudate noted.   NECK:  Supple w/ fair ROM; no JVD; normal carotid impulses w/o bruits; no thyromegaly or nodules palpated; no lymphadenopathy.    RESP  Clear  P & A; w/o, wheezes/ rales/ or rhonchi. no accessory muscle use, no dullness to percussion  CARD:  RRR, no m/r/g,tr  peripheral edema, pulses intact, no cyanosis or clubbing.  GI:   Soft & nt; nml bowel sounds; no organomegaly or masses detected.   Musco: Warm bil, no deformities or joint swelling noted.   Neuro: alert, no focal deficits noted.    Skin: Warm,  no lesions or rashes    Lab Results:  CBC    Component Value Date/Time   WBC 9.8 06/28/2016 1233   RBC 4.61 06/28/2016 1233   HGB 15.1 06/28/2016 1233   HCT 44.1 06/28/2016 1233   PLT 239.0 06/28/2016 1233   MCV 95.6 06/28/2016 1233   MCHC 34.2 06/28/2016 1233   RDW 14.6 06/28/2016 1233   LYMPHSABS 2.0 06/28/2016 1233   MONOABS 0.8 06/28/2016 1233   EOSABS 0.5 06/28/2016 1233   BASOSABS 0.0 06/28/2016 1233    BMET  BNP No results found for: BNP  ProBNP No results found for: PROBNP  Imaging: No results found.   Assessment & Plan:   Postinflammatory pulmonary fibrosis (HCC) Pt is to follow up with Dr. Sherene Sires  In 6 weeks , CXR shows chronic changes  May need to procede with HRCT chest and PFT to take better look at to get baseline .   Chronic respiratory failure with hypoxia [facility]) Patient Instructions  Begin on Oxygen 2l/m with walking .  Order sent for evaluation for POC Oxygen device .  Continue on current regimen  Follow up Dr. Sherene Sires  In 4 weeks and As needed          Rubye Oaks, NP 08/22/2016

## 2016-08-22 NOTE — Assessment & Plan Note (Signed)
Pt is to follow up with Dr. Sherene SiresWert  In 6 weeks , CXR shows chronic changes  May need to procede with HRCT chest and PFT to take better look at to get baseline .

## 2016-08-22 NOTE — Assessment & Plan Note (Signed)
Patient Instructions  Begin on Oxygen 2l/m with walking .  Order sent for evaluation for POC Oxygen device .  Continue on current regimen  Follow up Dr. Sherene SiresWert  In 4 weeks and As needed

## 2016-08-22 NOTE — Patient Instructions (Addendum)
Begin on Oxygen 2l/m with walking .  Order sent for evaluation for POC Oxygen device .  Continue on current regimen  Follow up Dr. Sherene SiresWert  In 4 weeks and As needed

## 2016-08-23 ENCOUNTER — Telehealth: Payer: Self-pay | Admitting: Internal Medicine

## 2016-08-23 NOTE — Telephone Encounter (Signed)
Pt daughter aware that the order was placed for the O2 on 08/22/16 at 140pm and should have been received by their office. Daughter given the number to [facility] to call to inquire. Nothing further needed.

## 2016-08-23 NOTE — Progress Notes (Signed)
Chart and office note reviewed in detail s > agree with a/p as outlined   

## 2016-09-19 ENCOUNTER — Ambulatory Visit (INDEPENDENT_AMBULATORY_CARE_PROVIDER_SITE_OTHER): Payer: Medicare Other | Admitting: Internal Medicine

## 2016-09-19 ENCOUNTER — Encounter: Payer: Self-pay | Admitting: Internal Medicine

## 2016-09-19 VITALS — BP 126/70 | HR 76 | Ht 66.0 in | Wt 155.4 lb

## 2016-09-19 DIAGNOSIS — J8410 Pulmonary fibrosis, unspecified: Secondary | ICD-10-CM | POA: Diagnosis not present

## 2016-09-19 DIAGNOSIS — J9611 Chronic respiratory failure with hypoxia: Secondary | ICD-10-CM | POA: Diagnosis not present

## 2016-09-19 NOTE — Patient Instructions (Signed)
Please schedule a follow up visit in 6  months but call sooner if needed with 6 minute walk on 02 on return

## 2016-09-19 NOTE — Progress Notes (Signed)
Subjective:    Patient ID: Jack Solis, male    DOB: May 31, 1934  MRN: [id number]    Brief patient profile:  [age] yowm never smoker ? Labored breathing developed one week prior to OV  With Dr Cliffton AstersWhite with cxr 12/09/13 ? [facility] and referred to pulmonary clinic 12/29/2013 by Dr Cliffton AstersWhite with dx of probably PF    Rheum = Zenovia Jordanngela Hawkes   12/29/2013 1st [facility] office visit/ Jack Solis  Chief Complaint  Patient presents with  . PULMONARY CONSULT    Referred by Dr Tiburcio PeaHarris. Per friend, pt was dx with walking PNA in the last week-- [facility]?  hoarseness x sev years but otherwise no sign pulmonary problems and not clear he had any acute awareness of any problem prior to his cxr 12/09/13 which suggested low lung vol, ? ILD ? MAI ? rml infiltrates.   No previous exp to chemo or amiodarone or any form of ALI on the EMR.  Does have ? RA but not active. Walked a block = his baseline day of ov s change pace or sob > baseline x years  rec Pace yourself to about half the speed you normally go until we sort your problems out  Please schedule a follow up office visit in 4 weeks, sooner if needed with all meds from all doctors  in hand> did not do    08/22/16 NP Begin on Oxygen 2l/m with walking .  Order sent for evaluation for POC Oxygen device .  Continue on current regimen     09/19/2016  f/u ov/Jack Solis re:  PF/ connective tissue dz/ only on plaquenil  Chief Complaint  Patient presents with  . Follow-up    Wears 2L O2 ([facility]) at home. No cough or sob per pt.Caregiver requesting to get help with ADL'S (bathing,etc.) 2 x wk.   no attendant with him today and he denies any symptoms at all   Apparently Not limited by breathing from desired activities  On 2lpm but comes to office off 02   No change in arthritis symptoms  By NH records/pts sketchy hx :  No obvious day to day or daytime variability or assoc excess/ purulent sputum or mucus plugs or hemoptysis or cp  or chest tightness, subjective wheeze or overt sinus or hb symptoms. No unusual exp hx or h/o childhood pna/ asthma or knowledge of premature birth.  Sleeping ok without nocturnal  or early am exacerbation  of respiratory  c/o's or need for noct saba. Also denies any obvious fluctuation of symptoms with weather or environmental changes or other aggravating or alleviating factors except as outlined above   Current Medications, Allergies, Complete Past Medical History, Past Surgical History, Family History, and Social History were reviewed in [facility] electronic medical record.  ROS  The following are not active complaints unless bolded sore throat, dysphagia, dental problems, itching, sneezing,  nasal congestion or excess/ purulent secretions, ear ache,   fever, chills, sweats, unintended wt loss, classically pleuritic or exertional cp,  orthopnea pnd or leg swelling, presyncope, palpitations, abdominal pain, anorexia, nausea, vomiting, diarrhea  or change in bowel or bladder habits, change in stools or urine, dysuria,hematuria,  rash, arthralgias, visual complaints, headache, numbness, weakness or ataxia or problems with walking or coordination,  change in mood/affect or memory.                   Objective:   Physical Exam  09/19/2016  155  06/28/2016          159   12/29/13 166 lb 12.8 oz (75.66 kg)  02/06/09 136 lb (61.689 kg)     Elderly wm doesn't really engage in conversation, one or two word answers  Vital signs reviewed.   - Note on arrival 02 sats  91% on RA    HEENT: nl dentition, turbinates, and oropharynx. Nl external ear canals without cough reflex   NECK :  without JVD/Nodes/TM/ nl carotid upstrokes bilaterally   LUNGS: no acc muscle use, distant insp crackles bilaterally with no cough on insp   CV:  RRR  no s3 or murmur or increase in P2, no edema   ABD:  soft and nontender with nl excursion in the supine position. No bruits or organomegaly, bowel sounds  nl  MS:  warm without deformities, calf tenderness, cyanosis or clubbing  SKIN: warm and dry without lesions    NEURO:  alert, oriented to person     CXR PA and Lateral:   06/28/2016 :    I personally reviewed images and agree with radiology impression as follows:    Chronic lung changes without evidence of superimposed acute cardiopulmonary disease.   Lab Results  Component Value Date   WBC 9.8 06/28/2016   HGB 15.1 06/28/2016   HCT 44.1 06/28/2016   MCV 95.6 06/28/2016   PLT 239.0 06/28/2016       EOS                       0.5       06/28/2016    Lab Results  Component Value Date   ESRSEDRATE 29 (H) 06/28/2016   ESRSEDRATE 41 (H) 12/29/2013   ESRSEDRATE 100 (H) 03/19/2009                     Assessment & Plan:

## 2016-09-20 ENCOUNTER — Encounter: Payer: Self-pay | Admitting: Internal Medicine

## 2016-09-20 NOTE — Assessment & Plan Note (Addendum)
08/22/16  Patient Saturations on Room Air at Rest = 91% Patient Saturations on [employer] while Ambulating = 85% Patient Saturations on 2 Liters of oxygen while Ambulating = 95%     Still ok on RA at rest  but needs 02 with more than room to room

## 2016-09-20 NOTE — Assessment & Plan Note (Addendum)
-  ESR  12/29/2013  = 41 - 12/29/2013   Walked RA x one lap @ 185 stopped due to  84% moderate pace - 06/28/2016   Walked RA x one lap @ 185 stopped due to  Sob/fatigue with sats dropping to 78% moderate pace - ESR 06/28/2016  29/ Eos 0.5  - 08/02/2016   Walked @ 3lpm 2 laps @ 185 ft each stopped due to  Sob but sats 91% at end nl pace - 08/22/16 desats > see chronic resp failure    Clinic course is relatively benign c/w PF assoc with ILD and given cognitive and geriatric decline would treat just with 02 as needed to keep > 90%   For sudden worsening could consider short course of prednisone but risk CNS effects of any high dose or prolonged rx so would avoid for now

## 2016-12-23 DIAGNOSIS — H1032 Unspecified acute conjunctivitis, left eye: Secondary | ICD-10-CM | POA: Diagnosis not present

## 2017-01-20 DIAGNOSIS — R630 Anorexia: Secondary | ICD-10-CM | POA: Diagnosis not present

## 2017-01-20 DIAGNOSIS — R0602 Shortness of breath: Secondary | ICD-10-CM | POA: Diagnosis not present

## 2017-01-20 DIAGNOSIS — R4189 Other symptoms and signs involving cognitive functions and awareness: Secondary | ICD-10-CM | POA: Diagnosis not present

## 2017-01-21 ENCOUNTER — Ambulatory Visit
Admission: RE | Admit: 2017-01-21 | Discharge: 2017-01-21 | Disposition: A | Discharge status: Home / Self Care | Payer: Medicare Other | Source: Ambulatory Visit | Attending: Family Medicine | Admitting: Family Medicine

## 2017-01-21 ENCOUNTER — Other Ambulatory Visit: Payer: Self-pay | Admitting: Family Medicine

## 2017-01-21 DIAGNOSIS — R0602 Shortness of breath: Secondary | ICD-10-CM

## 2017-01-22 ENCOUNTER — Other Ambulatory Visit: Payer: Self-pay | Admitting: *Deleted

## 2017-01-22 NOTE — Patient Outreach (Signed)
[facility]) Care Management  01/22/2017  Jack Solis 15-Aug-1933 [id number]  [facility]: [facility] to assess needs at home/needs home health/homebound/dementia/fall risk/dyspnea on exertion.  Subjective/Objective:  Telephone call to patient/daughter HIPPA compliant voice mail requesting return call.   Received return call from daughter/[name] who voices that she is patient's health care power of attorney & caregiver. States she lives with patient in his home & speaks for him. States patient is very hard of hearing & has dementia with cognitive deficits. Stacey/caregiver/health care power of attorney advised of [facility] care management services. HIPPA verification received from daughter/caregiver.   [name] voices that private pay companion is with patient from 10 am to PM and 4:30 to PM. Companion prepares meals, takes patient to MD appointments. Patient refuses to takes baths but washes off at sink. Voices that patient requires no assistance with walking. States he gets short of breath with moderate walking. Currently using home oxygen as he needs it. Daughter is not sure of how many liters patient uses.  States he has diagnosis of Pulmonary fibrosis & recently had xray & diagnosed with walking pneumonia. States antibiotic was started today. Stacey/daughter voices that she manages patient's medications and makes sure he takes them.    Daughter patient is not very active-watches a lot of TV. States is not sleeping through the night and sometimes gets us anxious.   Asked about future plans for patient's care. States currently she wants to keep patient home. Wants to get additional help during the night to assist with patient's care. [place] [employer] & has not ben able to sleep much during the night because patient is up. States she is interested in activities that patient could get involved in during the day. States companion caregiver could takes him to activities during  the day.   Assessment: -Patient with cognitive deficits( dx dementia) - Has hearing lost (daughter states he lost hearing aids -dx Pulmonary fibrosis, COPD ( uses oxygen at home-daughter not aware of how many liters)-MD progress notes states 2 liters Last saw Pulmonologist-Dr. Sherene SiresWert in March 2018 -Daughter -[name] states she & sister share MidwifeHealthcare Power of Attorney for patient-   -Private pay companion cares for patient from 10 AM-3 PM & 4:30 PM-* 8 PM. Companion transports patient to MD appointments -Daughter manages patient's medications & makes sure he takes medications. -Patient has difficulty breathing with exertion. Not getting much exercise -Recent diagnosis with "walking pneumonia" ; started antibiotics  08/01 -Patient does not eat sometimes even after meals has been prepared -Patient is falls risk; has walker but does not use -Daughter/caregiver/health care power of attorney works during the day -Daughter/healthcare power of attorney/Stacey consents to [facility] services. Interested in case manager health assessments of patient at  home; [employer] for patient . Contact number--[phone]  Plan: Refer to care management assistant to assign to [facility] care coordinator for complex case management-assess patient home needs per MD request.  Refer to Clinical Social Worker for [employer] & psychosocial needs.  Colleen CanLinda Javier Gell, RN BSN CCM Care Management Coordinator [facility] Care [facility]  [phone]

## 2017-01-23 ENCOUNTER — Other Ambulatory Visit: Payer: Self-pay | Admitting: Licensed Clinical Social Worker

## 2017-01-27 ENCOUNTER — Telehealth: Payer: Self-pay

## 2017-01-27 ENCOUNTER — Other Ambulatory Visit: Payer: Self-pay | Admitting: Licensed Clinical Social Worker

## 2017-01-27 NOTE — Telephone Encounter (Signed)
On 01/27/2017 I [facility] The death certificate is for burial. The patient is a patient of Company secretaryDoctor Wert. The death certificate will be taken to Pulmonary Unit @ [facility] this am for signature.  On 01/27/2017 I received the death certificate back from Doctor Wert. I got the death certificate ready and called the funeral home to let them know the death certificate is ready for pickup.

## 2017-01-27 NOTE — Patient Outreach (Signed)
[facility]) Care Management  01/27/2017  Jack MedinaRaphael Solis 1934/06/15 [id number]  Assessment- CSW completed second outreach attempt to patient's daughter Jack Solis. Stacey answered and provided HIPPA verifications. Daughter informed CSW that she will not longer be needing [facility] and patient passed away on 02/09/2017. CSW offered emotional support and condolences. CSW will complete case closure at this time.  Plan-CSW will update [facility] team and Care Management Assistant.  Jack Solis, Jack Solis, Jack Solis, Jack Solis [facility] Care Management [email or web address] Phone: [phone] Fax: [phone]

## 2017-02-22 DIAGNOSIS — 419620001 Death: Secondary | SNOMED CT | POA: Diagnosis not present

## 2017-02-22 NOTE — Patient Outreach (Addendum)
[facility]) Care Management  02/07/2017  Jack MedinaRaphael Solis 10-24-1933 [id number]  Assessment-CSW completed outreach attempt today to daughter after receiving new referral on patient. CSW unable to reach patient's daughter successfully. CSW left a HIPPA compliant voice message encouraging patient's daughter to return call once available.  Plan-CSW will await return call or complete an additional outreach if needed.  [name], BSW, MSW, LCSW [facility] Care Management [name] Phone: [phone] Fax: [phone]

## 2017-02-22 DEATH — deceased

## 2017-03-24 ENCOUNTER — Ambulatory Visit: Payer: Medicare Other

## 2017-03-24 ENCOUNTER — Ambulatory Visit: Payer: Medicare Other | Admitting: Internal Medicine
